# Patient Record
Sex: Female | Born: 1935 | Race: White | Hispanic: No | Smoking: Never smoker
Health system: Southern US, Community
[De-identification: ages and names within clinical notes are randomized; demographics above are authoritative.]

## PROBLEM LIST (undated history)

## (undated) DIAGNOSIS — F329 Major depressive disorder, single episode, unspecified: Secondary | ICD-10-CM

## (undated) DIAGNOSIS — F0280 Dementia in other diseases classified elsewhere without behavioral disturbance: Secondary | ICD-10-CM

## (undated) DIAGNOSIS — K562 Volvulus: Secondary | ICD-10-CM

## (undated) DIAGNOSIS — E119 Type 2 diabetes mellitus without complications: Secondary | ICD-10-CM

## (undated) DIAGNOSIS — G309 Alzheimer's disease, unspecified: Secondary | ICD-10-CM

## (undated) DIAGNOSIS — F0390 Unspecified dementia without behavioral disturbance: Secondary | ICD-10-CM

---

## 2018-11-16 ENCOUNTER — Other Ambulatory Visit: Payer: Self-pay

## 2018-11-16 ENCOUNTER — Emergency Department (HOSPITAL_COMMUNITY): Payer: Medicare Other

## 2018-11-16 ENCOUNTER — Inpatient Hospital Stay (HOSPITAL_COMMUNITY)
Admission: EM | Admit: 2018-11-16 | Discharge: 2018-11-19 | DRG: 552 | Disposition: A | Discharge status: Skilled Nursing Facility | Payer: Medicare Other | Source: Skilled Nursing Facility | Attending: Family Medicine | Admitting: Family Medicine

## 2018-11-16 ENCOUNTER — Encounter (HOSPITAL_COMMUNITY): Payer: Self-pay | Admitting: Emergency Medicine

## 2018-11-16 DIAGNOSIS — F329 Major depressive disorder, single episode, unspecified: Secondary | ICD-10-CM | POA: Diagnosis not present

## 2018-11-16 DIAGNOSIS — E119 Type 2 diabetes mellitus without complications: Secondary | ICD-10-CM

## 2018-11-16 DIAGNOSIS — G309 Alzheimer's disease, unspecified: Secondary | ICD-10-CM | POA: Diagnosis not present

## 2018-11-16 DIAGNOSIS — S22031A Stable burst fracture of third thoracic vertebra, initial encounter for closed fracture: Secondary | ICD-10-CM | POA: Diagnosis present

## 2018-11-16 DIAGNOSIS — F0280 Dementia in other diseases classified elsewhere without behavioral disturbance: Secondary | ICD-10-CM | POA: Diagnosis present

## 2018-11-16 DIAGNOSIS — W1830XA Fall on same level, unspecified, initial encounter: Secondary | ICD-10-CM | POA: Diagnosis not present

## 2018-11-16 DIAGNOSIS — Z79899 Other long term (current) drug therapy: Secondary | ICD-10-CM

## 2018-11-16 DIAGNOSIS — Z794 Long term (current) use of insulin: Secondary | ICD-10-CM | POA: Diagnosis not present

## 2018-11-16 DIAGNOSIS — W19XXXA Unspecified fall, initial encounter: Secondary | ICD-10-CM

## 2018-11-16 DIAGNOSIS — Y92129 Unspecified place in nursing home as the place of occurrence of the external cause: Secondary | ICD-10-CM

## 2018-11-16 DIAGNOSIS — S22001A Stable burst fracture of unspecified thoracic vertebra, initial encounter for closed fracture: Secondary | ICD-10-CM | POA: Diagnosis not present

## 2018-11-16 DIAGNOSIS — F0390 Unspecified dementia without behavioral disturbance: Secondary | ICD-10-CM | POA: Diagnosis present

## 2018-11-16 HISTORY — DX: Type 2 diabetes mellitus without complications: E11.9

## 2018-11-16 HISTORY — DX: Dementia in other diseases classified elsewhere, unspecified severity, without behavioral disturbance, psychotic disturbance, mood disturbance, and anxiety: F02.80

## 2018-11-16 HISTORY — DX: Major depressive disorder, single episode, unspecified: F32.9

## 2018-11-16 HISTORY — DX: Unspecified dementia, unspecified severity, without behavioral disturbance, psychotic disturbance, mood disturbance, and anxiety: F03.90

## 2018-11-16 HISTORY — DX: Volvulus: K56.2

## 2018-11-16 HISTORY — DX: Alzheimer's disease, unspecified: G30.9

## 2018-11-16 LAB — CBC WITH DIFFERENTIAL/PLATELET
Abs Immature Granulocytes: 0.08 10*3/uL — ABNORMAL HIGH (ref 0.00–0.07)
Basophils Absolute: 0 10*3/uL (ref 0.0–0.1)
Basophils Relative: 0 %
Eosinophils Absolute: 0 10*3/uL (ref 0.0–0.5)
Eosinophils Relative: 0 %
HCT: 39.3 % (ref 36.0–46.0)
Hemoglobin: 12.5 g/dL (ref 12.0–15.0)
Immature Granulocytes: 1 %
Lymphocytes Relative: 7 %
Lymphs Abs: 0.6 10*3/uL — ABNORMAL LOW (ref 0.7–4.0)
MCH: 30.1 pg (ref 26.0–34.0)
MCHC: 31.8 g/dL (ref 30.0–36.0)
MCV: 94.7 fL (ref 80.0–100.0)
Monocytes Absolute: 0.6 10*3/uL (ref 0.1–1.0)
Monocytes Relative: 6 %
NRBC: 0 % (ref 0.0–0.2)
Neutro Abs: 7.5 10*3/uL (ref 1.7–7.7)
Neutrophils Relative %: 86 %
Platelets: 149 10*3/uL — ABNORMAL LOW (ref 150–400)
RBC: 4.15 MIL/uL (ref 3.87–5.11)
RDW: 13.3 % (ref 11.5–15.5)
WBC: 8.8 10*3/uL (ref 4.0–10.5)

## 2018-11-16 LAB — COMPREHENSIVE METABOLIC PANEL
ALT: 21 U/L (ref 0–44)
AST: 25 U/L (ref 15–41)
Albumin: 3.8 g/dL (ref 3.5–5.0)
Alkaline Phosphatase: 49 U/L (ref 38–126)
Anion gap: 10 (ref 5–15)
BUN: 12 mg/dL (ref 8–23)
CO2: 24 mmol/L (ref 22–32)
Calcium: 8.4 mg/dL — ABNORMAL LOW (ref 8.9–10.3)
Chloride: 105 mmol/L (ref 98–111)
Creatinine, Ser: 0.5 mg/dL (ref 0.44–1.00)
GFR calc non Af Amer: >60 mL/min (ref >60)
Glucose, Bld: 149 mg/dL — ABNORMAL HIGH (ref 70–99)
Potassium: 3.6 mmol/L (ref 3.5–5.1)
SODIUM: 139 mmol/L (ref 135–145)
Total Bilirubin: 1.1 mg/dL (ref 0.3–1.2)
Total Protein: 6.5 g/dL (ref 6.5–8.1)

## 2018-11-16 LAB — GLUCOSE, CAPILLARY
GLUCOSE-CAPILLARY: 138 mg/dL — AB (ref 70–99)
Glucose-Capillary: 131 mg/dL — ABNORMAL HIGH (ref 70–99)

## 2018-11-16 LAB — HEMOGLOBIN A1C
Hgb A1c MFr Bld: 6.1 % — ABNORMAL HIGH (ref 4.8–5.6)
Mean Plasma Glucose: 128.37 mg/dL

## 2018-11-16 LAB — MAGNESIUM: Magnesium: 2 mg/dL (ref 1.7–2.4)

## 2018-11-16 LAB — PHOSPHORUS: Phosphorus: 3.1 mg/dL (ref 2.5–4.6)

## 2018-11-16 LAB — MRSA PCR SCREENING: MRSA by PCR: NEGATIVE

## 2018-11-16 MED ORDER — ACETAMINOPHEN 325 MG PO TABS
650.0000 mg | ORAL_TABLET | Freq: Four times a day (QID) | ORAL | Status: DC | PRN
  Administered 2018-11-16: 650 mg via ORAL
  Filled 2018-11-16: qty 2

## 2018-11-16 MED ORDER — ONDANSETRON HCL 4 MG/2ML IJ SOLN: 4.0000 mg | Freq: Four times a day (QID) | INTRAMUSCULAR | Status: DC | PRN

## 2018-11-16 MED ORDER — OXYCODONE HCL 5 MG PO TABS: 2.5000 mg | ORAL_TABLET | ORAL | Status: DC | PRN

## 2018-11-16 MED ORDER — MORPHINE SULFATE (PF) 2 MG/ML IV SOLN
2.0000 mg | INTRAVENOUS | Status: DC | PRN
  Administered 2018-11-16: 2 mg via INTRAVENOUS
  Filled 2018-11-16: qty 1

## 2018-11-16 MED ORDER — KETOROLAC TROMETHAMINE 15 MG/ML IJ SOLN: 15.0000 mg | Freq: Once | INTRAMUSCULAR | Status: DC

## 2018-11-16 MED ORDER — FAMOTIDINE 20 MG PO TABS
20.0000 mg | ORAL_TABLET | Freq: Every day | ORAL | Status: DC
  Administered 2018-11-16 – 2018-11-19 (×4): 20 mg via ORAL
  Filled 2018-11-16 (×4): qty 1

## 2018-11-16 MED ORDER — MORPHINE SULFATE (PF) 2 MG/ML IV SOLN: 2.0000 mg | INTRAVENOUS | Status: DC | PRN

## 2018-11-16 MED ORDER — ENOXAPARIN SODIUM 30 MG/0.3ML ~~LOC~~ SOLN
30.0000 mg | SUBCUTANEOUS | Status: DC
  Administered 2018-11-17 – 2018-11-19 (×3): 30 mg via SUBCUTANEOUS
  Filled 2018-11-16 (×3): qty 0.3

## 2018-11-16 MED ORDER — DONEPEZIL HCL 10 MG PO TABS
10.0000 mg | ORAL_TABLET | Freq: Every day | ORAL | Status: DC
  Administered 2018-11-16 – 2018-11-18 (×3): 10 mg via ORAL
  Filled 2018-11-16 (×3): qty 1

## 2018-11-16 MED ORDER — SERTRALINE HCL 25 MG PO TABS
25.0000 mg | ORAL_TABLET | Freq: Every day | ORAL | Status: DC
  Administered 2018-11-16 – 2018-11-19 (×4): 25 mg via ORAL
  Filled 2018-11-16 (×4): qty 1

## 2018-11-16 MED ORDER — MEMANTINE HCL 10 MG PO TABS
10.0000 mg | ORAL_TABLET | Freq: Two times a day (BID) | ORAL | Status: DC
  Administered 2018-11-16 – 2018-11-19 (×7): 10 mg via ORAL
  Filled 2018-11-16 (×7): qty 1

## 2018-11-16 MED ORDER — INSULIN ASPART 100 UNIT/ML ~~LOC~~ SOLN
0.0000 [IU] | Freq: Three times a day (TID) | SUBCUTANEOUS | Status: DC
  Administered 2018-11-16: 1 [IU] via SUBCUTANEOUS
  Administered 2018-11-17 (×2): 2 [IU] via SUBCUTANEOUS
  Administered 2018-11-18: 1 [IU] via SUBCUTANEOUS

## 2018-11-16 MED ORDER — KETOROLAC TROMETHAMINE 15 MG/ML IJ SOLN
15.0000 mg | Freq: Three times a day (TID) | INTRAMUSCULAR | Status: DC
  Administered 2018-11-16: 15 mg via INTRAVENOUS
  Filled 2018-11-16 (×2): qty 1

## 2018-11-16 MED ORDER — ENSURE ENLIVE PO LIQD
237.0000 mL | Freq: Every day | ORAL | Status: DC
  Administered 2018-11-16 – 2018-11-18 (×2): 237 mL via ORAL

## 2018-11-16 MED ORDER — AMITRIPTYLINE HCL 25 MG PO TABS
12.5000 mg | ORAL_TABLET | Freq: Every day | ORAL | Status: DC
  Administered 2018-11-16 – 2018-11-18 (×3): 12.5 mg via ORAL
  Filled 2018-11-16 (×3): qty 0.5

## 2018-11-16 NOTE — Plan of Care (Signed)
  Problem: Education: Goal: Knowledge of General Education information will improve Description Including pain rating scale, medication(s)/side effects and non-pharmacologic comfort measures 11/16/2018 2028 by Iantha Fallenlark, Venisa Frampton E, RN Outcome: Progressing 11/16/2018 2028 by Iantha Fallenlark, Haniyyah Sakuma E, RN Outcome: Progressing   Problem: Health Behavior/Discharge Planning: Goal: Ability to manage health-related needs will improve Outcome: Progressing   Problem: Clinical Measurements: Goal: Will remain free from infection Outcome: Progressing Goal: Respiratory complications will improve Outcome: Progressing

## 2018-11-16 NOTE — ED Provider Notes (Signed)
Emergency Department Provider Note   I have reviewed the triage vital signs and the nursing notes.   HISTORY  Chief Complaint Fall and Back Pain   HPI Katherine Wilson is a 84 y.o. female with PMH of Dementia, DM, and depression presents to the ED for evaluation after fall.  Patient lives at [facility].  Staff heard the patient yelling and found her on the floor.  Unknown mechanism of fall.  Patient was primarily complaining of back pain at that time.  She continues to complain of back discomfort with me here but states it is mid back and slightly to the right. She denies any shortness of breath.  No numbness or tingling.   Level 5 caveat: Dementia    Past Medical History:  Diagnosis Date  . Alzheimer disease (HCC)   . Dementia (HCC)   . Diabetes mellitus without complication (HCC)   . Major depression   . Volvulus (HCC)     There are no active problems to display for this patient.   History reviewed. No pertinent surgical history.  Allergies Chicken allergy; Eggs or egg-derived products; and Latex  History reviewed. No pertinent family history.  Social History Social History   Tobacco Use  . Smoking status: Never Smoker  . Smokeless tobacco: Never Used  Substance Use Topics  . Alcohol use: Not Currently  . Drug use: Not Currently    Review of Systems  Constitutional: No fever/chills Cardiovascular: Denies chest pain. Respiratory: Denies shortness of breath. Gastrointestinal: No abdominal pain.  No nausea, no vomiting.  No diarrhea.  No constipation. Musculoskeletal: Positive mid back/posterior chest wall pain.  Skin: Negative for rash. Neurological: Negative for headaches.  10-point ROS otherwise negative.  ____________________________________________   PHYSICAL EXAM:  VITAL SIGNS: ED Triage Vitals  Enc Vitals Group     BP 11/16/18 0654 138/65     Pulse Rate 11/16/18 0654 66     Resp 11/16/18 0654 14     Temp 11/16/18 0654 97.9 F (36.6 C)       Temp Source 11/16/18 0654 Oral     SpO2 11/16/18 0654 97 %     Pain Score 11/16/18 0637 4   Constitutional: Alert and oriented. Well appearing and in no acute distress. Eyes: Conjunctivae are normal. PERRL.  Head: Atraumatic. Nose: No congestion/rhinnorhea. Mouth/Throat: Mucous membranes are moist.  Neck: No stridor. No cervical spine tenderness to palpation. Cardiovascular: Normal rate, regular rhythm. Good peripheral circulation. Grossly normal heart sounds.   Respiratory: Normal respiratory effort.  No retractions. Lungs CTAB. Gastrointestinal: Soft and nontender. No distention.  Musculoskeletal: No lower extremity tenderness nor edema. No gross deformities of extremities. Normal passive ROM of the bilateral hips and knees without pain. No midline thoracic or lumbar spine tenderness. Mild tenderness over the right posterior chest wall. No crepitus.  Neurologic:  Normal speech and language. No gross focal neurologic deficits are appreciated.  Skin:  Skin is warm, dry and intact. No rash noted.  ____________________________________________  RADIOLOGY  Dg Chest 2 View  Result Date: 11/16/2018 CLINICAL DATA:  Chest pain EXAM: CHEST - 2 VIEW COMPARISON:  None. FINDINGS: The patient's mandible obscures portions of the apices. There is atelectatic change in the left lower lobe. Visualized lungs elsewhere clear. Heart size and pulmonary vascularity are normal. No adenopathy evident. Aorta is prominent. No bone lesions evident. IMPRESSION: Note that patient's mandible obscures portions of the apices. There is atelectatic change in the left lower lobe. Visualized lungs elsewhere clear. Heart size  normal. Prominence of the aorta may reflect chronic hypertensive change. Electronically Signed   By: Bretta BangWilliam  Woodruff III M.D.   On: 11/16/2018 08:00   Ct Head Wo Contrast  Result Date: 11/16/2018 CLINICAL DATA:  Unwitnessed fall.  Alzheimer's dementia EXAM: CT HEAD WITHOUT CONTRAST CT CERVICAL  SPINE WITHOUT CONTRAST TECHNIQUE: Multidetector CT imaging of the head and cervical spine was performed following the standard protocol without intravenous contrast. Multiplanar CT image reconstructions of the cervical spine were also generated. COMPARISON:  None. FINDINGS: CT HEAD FINDINGS Brain: There is moderate diffuse atrophy. There is no intracranial mass, hemorrhage, extra-axial fluid collection, or midline shift. There is patchy small vessel disease in the centra semiovale bilaterally. There is a small lacunar infarct in the genu of the left internal capsule. No acute appearing infarct is demonstrable on this study. Vascular: There is no hyperdense vessel. There is calcification in each carotid siphon region. Skull: The bony calvarium appears intact. There is frontal hyperostosis. Sinuses/Orbits: There is mucosal thickening in several ethmoid air cells as well as in portions of the right maxillary antrum. There is also opacification in the inferomedial right frontal sinus. Orbits appear symmetric bilaterally except for evidence of cataract removal on the left. Other: Mastoid air cells are clear. CT CERVICAL SPINE FINDINGS Note that there is a degree of motion artifact. Alignment: There is 3 mm of retrolisthesis of C6 on C7. No other spondylolisthesis is evident. Skull base and vertebrae: Skull base and craniocervical junction regions appear normal. There is pannus posterior to the odontoid, not causing appreciable impression on the craniocervical junction. There is a wedge fracture of the T3 vertebral body with mild retropulsion of bone into the anterior thecal sac region. No fracture is appreciable in the cervical region. No blastic or lytic bone lesions are evident. Soft tissues and spinal canal: The prevertebral soft tissues and predental space regions are normal. There is no cord or canal hematoma evident. No paraspinous lesions. Disc levels: There is severe disc space narrowing at C5-6 and C6-7. There  is milder disc space narrowing at C4-5. There are prominent anterior osteophytes at C5 and C6. There is facet hypertrophy at multiple levels bilaterally. There is exit foraminal narrowing at multiple levels, including at C3-4 on the right, at C4-5 on the left, at C5-6 bilaterally, and C6-7 bilaterally. No frank disc extrusion or stenosis is evident. Upper chest: There is aortic atherosclerosis. Visualized upper lung regions are clear. Other: There are foci of calcification in each carotid artery. IMPRESSION: CT head: Atrophy with periventricular small vessel disease. Prior small lacunar infarct in the genu of the left internal capsule. No acute infarct. No mass or hemorrhage. There are foci of arterial vascular calcification. There are foci of paranasal sinus disease. CT cervical spine: Less than optimal study due to motion artifact. Wedge fracture of the T3 vertebral body, acute appearing, with evidence of mild retropulsion of bone posteriorly. This fracture appears acute. No acute appearing cervical fracture seen with limitations due to motion. Mild retrolisthesis of C6 on C7 is felt to be due to underlying spondylosis. Note that there is multilevel arthropathy with exit foraminal narrowing due to bony hypertrophy at multiple levels. There is aortic and carotid artery calcification. Aortic Atherosclerosis (ICD10-I70.0). Critical Value/emergent results were called by telephone at the time of interpretation on 11/16/2018 at 8:14 am to Dr. Alona BeneJOSHUA Angelamarie Avakian , who verbally acknowledged these results. Electronically Signed   By: Bretta BangWilliam  Woodruff III M.D.   On: 11/16/2018 08:14   Ct Cervical  Spine Wo Contrast  Result Date: 11/16/2018 CLINICAL DATA:  Unwitnessed fall.  Alzheimer's dementia EXAM: CT HEAD WITHOUT CONTRAST CT CERVICAL SPINE WITHOUT CONTRAST TECHNIQUE: Multidetector CT imaging of the head and cervical spine was performed following the standard protocol without intravenous contrast. Multiplanar CT image  reconstructions of the cervical spine were also generated. COMPARISON:  None. FINDINGS: CT HEAD FINDINGS Brain: There is moderate diffuse atrophy. There is no intracranial mass, hemorrhage, extra-axial fluid collection, or midline shift. There is patchy small vessel disease in the centra semiovale bilaterally. There is a small lacunar infarct in the genu of the left internal capsule. No acute appearing infarct is demonstrable on this study. Vascular: There is no hyperdense vessel. There is calcification in each carotid siphon region. Skull: The bony calvarium appears intact. There is frontal hyperostosis. Sinuses/Orbits: There is mucosal thickening in several ethmoid air cells as well as in portions of the right maxillary antrum. There is also opacification in the inferomedial right frontal sinus. Orbits appear symmetric bilaterally except for evidence of cataract removal on the left. Other: Mastoid air cells are clear. CT CERVICAL SPINE FINDINGS Note that there is a degree of motion artifact. Alignment: There is 3 mm of retrolisthesis of C6 on C7. No other spondylolisthesis is evident. Skull base and vertebrae: Skull base and craniocervical junction regions appear normal. There is pannus posterior to the odontoid, not causing appreciable impression on the craniocervical junction. There is a wedge fracture of the T3 vertebral body with mild retropulsion of bone into the anterior thecal sac region. No fracture is appreciable in the cervical region. No blastic or lytic bone lesions are evident. Soft tissues and spinal canal: The prevertebral soft tissues and predental space regions are normal. There is no cord or canal hematoma evident. No paraspinous lesions. Disc levels: There is severe disc space narrowing at C5-6 and C6-7. There is milder disc space narrowing at C4-5. There are prominent anterior osteophytes at C5 and C6. There is facet hypertrophy at multiple levels bilaterally. There is exit foraminal narrowing  at multiple levels, including at C3-4 on the right, at C4-5 on the left, at C5-6 bilaterally, and C6-7 bilaterally. No frank disc extrusion or stenosis is evident. Upper chest: There is aortic atherosclerosis. Visualized upper lung regions are clear. Other: There are foci of calcification in each carotid artery. IMPRESSION: CT head: Atrophy with periventricular small vessel disease. Prior small lacunar infarct in the genu of the left internal capsule. No acute infarct. No mass or hemorrhage. There are foci of arterial vascular calcification. There are foci of paranasal sinus disease. CT cervical spine: Less than optimal study due to motion artifact. Wedge fracture of the T3 vertebral body, acute appearing, with evidence of mild retropulsion of bone posteriorly. This fracture appears acute. No acute appearing cervical fracture seen with limitations due to motion. Mild retrolisthesis of C6 on C7 is felt to be due to underlying spondylosis. Note that there is multilevel arthropathy with exit foraminal narrowing due to bony hypertrophy at multiple levels. There is aortic and carotid artery calcification. Aortic Atherosclerosis (ICD10-I70.0). Critical Value/emergent results were called by telephone at the time of interpretation on 11/16/2018 at 8:14 am to Dr. Alona BeneJOSHUA Iriana Artley , who verbally acknowledged these results. Electronically Signed   By: Bretta BangWilliam  Woodruff III M.D.   On: 11/16/2018 08:14   Ct Thoracic Spine Wo Contrast  Result Date: 11/16/2018 CLINICAL DATA:  Found down at nursing home. History of diabetes, Alzheimer's/dementia. T3 fracture on cervical spine CT. EXAM: CT THORACIC  SPINE WITHOUT CONTRAST TECHNIQUE: Multidetector CT images of the thoracic were obtained using the standard protocol without intravenous contrast. COMPARISON:  CT cervical spine same date. FINDINGS: Alignment: Normal. Vertebrae: As demonstrated on earlier cervical spine CT, there is an acute-appearing burst fracture involving the T3  vertebral body. This is associated with approximately 40% loss the vertebral body height centrally and 3 mm of osseous retropulsion. The posterior elements appear intact, and there is no widening of the interpedicular distance. No other acute osseous findings are demonstrated. Paraspinal and other soft tissues: Probable mild paraspinal hemorrhage at the level of the T3 fracture. No evidence of epidural hematoma. There is atherosclerosis of the aorta, great vessels and coronary arteries. There are dependent opacities in both lungs which likely represent atelectasis. Probable left renal cyst. Disc levels: Mild degenerative changes throughout the visualized spine with posterior osteophytes. There is a small central disc protrusion at T6-7. No large disc herniation or high-grade spinal stenosis identified. IMPRESSION: 1. Acute burst fracture at T3 with 3 mm of osseous retropulsion. 2. No other acute findings within the thoracic spine. 3. Mild thoracic spondylosis.  Small disc protrusion at T6-7. Electronically Signed   By: Carey Bullocks M.D.   On: 11/16/2018 08:56    ____________________________________________   PROCEDURES  Procedure(s) performed:   Procedures  None ____________________________________________   INITIAL IMPRESSION / ASSESSMENT AND PLAN / ED COURSE  Pertinent labs & imaging results that were available during my care of the patient were reviewed by me and considered in my medical decision making (see chart for details).  Patient presents to the emergency department after fall from standing at her nursing home.  She has no significant midline spine tenderness.  Most of her pain seems to be right posterior chest wall. B/L breath sounds. Dementia limiting history. No concerning findings on exam. Plan for CT imaging or the head, c-spine, and CXR.   10:00 AM Spoke with Dr. Newell Coral with Neurosurgery regarding the thoracic spine fracture. He reviewed the case by phone and CT images.  The recommendation is 2-3 days of bedrest and pain control. Patient can be WBAT after that with PT/OT after that. No surgery intervention.   Also spoke with patient's POA listed in Epic, Dara Lords. She is aware of the CT findings and plan. Patient is a FULL CODE.   Discussed patient's case with Hospitalist, Dr. Robb Matar to request admission. Patient and family (if present) updated with plan. Care transferred to Hospitalist service.  I reviewed all nursing notes, vitals, pertinent old records, EKGs, labs, imaging (as available).  ____________________________________________  FINAL CLINICAL IMPRESSION(S) / ED DIAGNOSES  Final diagnoses:  Closed stable burst fracture of third thoracic vertebra, initial encounter [facility])  Fall, initial encounter    Note:  This document was prepared using Dragon voice recognition software and may include unintentional dictation errors.  Alona Bene, MD Emergency Medicine    Tenecia Ignasiak, Arlyss Repress, MD 11/16/18 1003

## 2018-11-16 NOTE — Plan of Care (Signed)
  Problem: Education: Goal: Knowledge of General Education information will improve Description: Including pain rating scale, medication(s)/side effects and non-pharmacologic comfort measures Outcome: Progressing   Problem: Clinical Measurements: Goal: Will remain free from infection Outcome: Progressing Goal: Respiratory complications will improve Outcome: Progressing   

## 2018-11-16 NOTE — ED Notes (Signed)
ED TO INPATIENT HANDOFF REPORT  Name/Age/Gender Katherine Wilson 84 y.o. female  Code Status    Code Status Orders  (From admission, onward)         Start     Ordered   11/16/18 1026  Full code  Continuous     11/16/18 1026        Code Status History    This patient has a current code status but no historical code status.      Home/SNF/Other Nursing Home  Chief Complaint fall, back pain  Level of Care/Admitting Diagnosis ED Disposition    ED Disposition Condition Comment   Admit  [facility] [[id number]]  Level of Care: Med-Surg [16]  Diagnosis: Burst fracture of thoracic vertebra, closed, initial encounter [facility](HCC) [[id number]]  Admitting Physician: Bobette MoTIZ, DAVID MANUEL [[id number]]  Attending Physician: Bobette MoORTIZ, DAVID MANUEL [[id number]]  Estimated length of stay: past midnight tomorrow  Certification:: I certify this patient will need inpatient services for at least 2 midnights  PT Class (Do Not Modify): Inpatient [101]  PT Acc Code (Do Not Modify): Private [1]       Medical History Past Medical History:  Diagnosis Date  . Alzheimer disease (HCC)   . Dementia (HCC)   . Diabetes mellitus without complication (HCC)   . Major depression   . Volvulus (HCC)     Allergies Allergies  Allergen Reactions  . Chicken Allergy Other (See Comments)    Per MAR - unknown reaction  . Eggs Or Egg-Derived Products Other (See Comments)    Per MAR - unknown reaction  . Latex Other (See Comments)    Per MAR - unknown reaction    IV Location/Drains/Wounds Patient Lines/Drains/Airways Status   Active Line/Drains/Airways    Name:   Placement date:   Placement time:   Site:   Days:   Peripheral IV 11/16/18 Left Antecubital   11/16/18    0623    Antecubital   less than 1          Labs/Imaging Results for orders placed or performed during the hospital encounter of 11/16/18 (from the past 48 hour(s))  CBC with Differential/Platelet     Status:  Abnormal   Collection Time: 11/16/18 10:41 AM  Result Value Ref Range   WBC 8.8 4.0 - 10.5 K/uL   RBC 4.15 3.87 - 5.11 MIL/uL   Hemoglobin 12.5 12.0 - 15.0 g/dL   HCT 95.639.3 21.336.0 - 08.646.0 %   MCV 94.7 80.0 - 100.0 fL   MCH 30.1 26.0 - 34.0 pg   MCHC 31.8 30.0 - 36.0 g/dL   RDW 57.813.3 46.911.5 - 62.915.5 %   Platelets 149 (L) 150 - 400 K/uL   nRBC 0.0 0.0 - 0.2 %   Neutrophils Relative % 86 %   Neutro Abs 7.5 1.7 - 7.7 K/uL   Lymphocytes Relative 7 %   Lymphs Abs 0.6 (L) 0.7 - 4.0 K/uL   Monocytes Relative 6 %   Monocytes Absolute 0.6 0.1 - 1.0 K/uL   Eosinophils Relative 0 %   Eosinophils Absolute 0.0 0.0 - 0.5 K/uL   Basophils Relative 0 %   Basophils Absolute 0.0 0.0 - 0.1 K/uL   Immature Granulocytes 1 %   Abs Immature Granulocytes 0.08 (H) 0.00 - 0.07 K/uL    Comment: [facility]formed at [facility], [address] [place], [place] [id number]   Dg Chest 2 View  Result Date: 11/16/2018 CLINICAL DATA:  Chest pain EXAM: CHEST -  2 VIEW COMPARISON:  None. FINDINGS: The patient's mandible obscures portions of the apices. There is atelectatic change in the left lower lobe. Visualized lungs elsewhere clear. Heart size and pulmonary vascularity are normal. No adenopathy evident. Aorta is prominent. No bone lesions evident. IMPRESSION: Note that patient's mandible obscures portions of the apices. There is atelectatic change in the left lower lobe. Visualized lungs elsewhere clear. Heart size normal. Prominence of the aorta may reflect chronic hypertensive change. Electronically Signed   By: Bretta BangWilliam  Woodruff III M.D.   On: 11/16/2018 08:00   Ct Head Wo Contrast  Result Date: 11/16/2018 CLINICAL DATA:  Unwitnessed fall.  Alzheimer's dementia EXAM: CT HEAD WITHOUT CONTRAST CT CERVICAL SPINE WITHOUT CONTRAST TECHNIQUE: Multidetector CT imaging of the head and cervical spine was performed following the standard protocol without intravenous contrast. Multiplanar CT image reconstructions of the  cervical spine were also generated. COMPARISON:  None. FINDINGS: CT HEAD FINDINGS Brain: There is moderate diffuse atrophy. There is no intracranial mass, hemorrhage, extra-axial fluid collection, or midline shift. There is patchy small vessel disease in the centra semiovale bilaterally. There is a small lacunar infarct in the genu of the left internal capsule. No acute appearing infarct is demonstrable on this study. Vascular: There is no hyperdense vessel. There is calcification in each carotid siphon region. Skull: The bony calvarium appears intact. There is frontal hyperostosis. Sinuses/Orbits: There is mucosal thickening in several ethmoid air cells as well as in portions of the right maxillary antrum. There is also opacification in the inferomedial right frontal sinus. Orbits appear symmetric bilaterally except for evidence of cataract removal on the left. Other: Mastoid air cells are clear. CT CERVICAL SPINE FINDINGS Note that there is a degree of motion artifact. Alignment: There is 3 mm of retrolisthesis of C6 on C7. No other spondylolisthesis is evident. Skull base and vertebrae: Skull base and craniocervical junction regions appear normal. There is pannus posterior to the odontoid, not causing appreciable impression on the craniocervical junction. There is a wedge fracture of the T3 vertebral body with mild retropulsion of bone into the anterior thecal sac region. No fracture is appreciable in the cervical region. No blastic or lytic bone lesions are evident. Soft tissues and spinal canal: The prevertebral soft tissues and predental space regions are normal. There is no cord or canal hematoma evident. No paraspinous lesions. Disc levels: There is severe disc space narrowing at C5-6 and C6-7. There is milder disc space narrowing at C4-5. There are prominent anterior osteophytes at C5 and C6. There is facet hypertrophy at multiple levels bilaterally. There is exit foraminal narrowing at multiple levels,  including at C3-4 on the right, at C4-5 on the left, at C5-6 bilaterally, and C6-7 bilaterally. No frank disc extrusion or stenosis is evident. Upper chest: There is aortic atherosclerosis. Visualized upper lung regions are clear. Other: There are foci of calcification in each carotid artery. IMPRESSION: CT head: Atrophy with periventricular small vessel disease. Prior small lacunar infarct in the genu of the left internal capsule. No acute infarct. No mass or hemorrhage. There are foci of arterial vascular calcification. There are foci of paranasal sinus disease. CT cervical spine: Less than optimal study due to motion artifact. Wedge fracture of the T3 vertebral body, acute appearing, with evidence of mild retropulsion of bone posteriorly. This fracture appears acute. No acute appearing cervical fracture seen with limitations due to motion. Mild retrolisthesis of C6 on C7 is felt to be due to underlying spondylosis. Note that there is  multilevel arthropathy with exit foraminal narrowing due to bony hypertrophy at multiple levels. There is aortic and carotid artery calcification. Aortic Atherosclerosis (ICD10-I70.0). Critical Value/emergent results were called by telephone at the time of interpretation on 11/16/2018 at 8:14 am to Dr. Alona Bene , who verbally acknowledged these results. Electronically Signed   By: Bretta Bang III M.D.   On: 11/16/2018 08:14   Ct Cervical Spine Wo Contrast  Result Date: 11/16/2018 CLINICAL DATA:  Unwitnessed fall.  Alzheimer's dementia EXAM: CT HEAD WITHOUT CONTRAST CT CERVICAL SPINE WITHOUT CONTRAST TECHNIQUE: Multidetector CT imaging of the head and cervical spine was performed following the standard protocol without intravenous contrast. Multiplanar CT image reconstructions of the cervical spine were also generated. COMPARISON:  None. FINDINGS: CT HEAD FINDINGS Brain: There is moderate diffuse atrophy. There is no intracranial mass, hemorrhage, extra-axial fluid  collection, or midline shift. There is patchy small vessel disease in the centra semiovale bilaterally. There is a small lacunar infarct in the genu of the left internal capsule. No acute appearing infarct is demonstrable on this study. Vascular: There is no hyperdense vessel. There is calcification in each carotid siphon region. Skull: The bony calvarium appears intact. There is frontal hyperostosis. Sinuses/Orbits: There is mucosal thickening in several ethmoid air cells as well as in portions of the right maxillary antrum. There is also opacification in the inferomedial right frontal sinus. Orbits appear symmetric bilaterally except for evidence of cataract removal on the left. Other: Mastoid air cells are clear. CT CERVICAL SPINE FINDINGS Note that there is a degree of motion artifact. Alignment: There is 3 mm of retrolisthesis of C6 on C7. No other spondylolisthesis is evident. Skull base and vertebrae: Skull base and craniocervical junction regions appear normal. There is pannus posterior to the odontoid, not causing appreciable impression on the craniocervical junction. There is a wedge fracture of the T3 vertebral body with mild retropulsion of bone into the anterior thecal sac region. No fracture is appreciable in the cervical region. No blastic or lytic bone lesions are evident. Soft tissues and spinal canal: The prevertebral soft tissues and predental space regions are normal. There is no cord or canal hematoma evident. No paraspinous lesions. Disc levels: There is severe disc space narrowing at C5-6 and C6-7. There is milder disc space narrowing at C4-5. There are prominent anterior osteophytes at C5 and C6. There is facet hypertrophy at multiple levels bilaterally. There is exit foraminal narrowing at multiple levels, including at C3-4 on the right, at C4-5 on the left, at C5-6 bilaterally, and C6-7 bilaterally. No frank disc extrusion or stenosis is evident. Upper chest: There is aortic  atherosclerosis. Visualized upper lung regions are clear. Other: There are foci of calcification in each carotid artery. IMPRESSION: CT head: Atrophy with periventricular small vessel disease. Prior small lacunar infarct in the genu of the left internal capsule. No acute infarct. No mass or hemorrhage. There are foci of arterial vascular calcification. There are foci of paranasal sinus disease. CT cervical spine: Less than optimal study due to motion artifact. Wedge fracture of the T3 vertebral body, acute appearing, with evidence of mild retropulsion of bone posteriorly. This fracture appears acute. No acute appearing cervical fracture seen with limitations due to motion. Mild retrolisthesis of C6 on C7 is felt to be due to underlying spondylosis. Note that there is multilevel arthropathy with exit foraminal narrowing due to bony hypertrophy at multiple levels. There is aortic and carotid artery calcification. Aortic Atherosclerosis (ICD10-I70.0). Critical Value/emergent results were called  by telephone at the time of interpretation on 11/16/2018 at 8:14 am to Dr. Alona Bene , who verbally acknowledged these results. Electronically Signed   By: Bretta Bang III M.D.   On: 11/16/2018 08:14   Ct Thoracic Spine Wo Contrast  Result Date: 11/16/2018 CLINICAL DATA:  Found down at nursing home. History of diabetes, Alzheimer's/dementia. T3 fracture on cervical spine CT. EXAM: CT THORACIC SPINE WITHOUT CONTRAST TECHNIQUE: Multidetector CT images of the thoracic were obtained using the standard protocol without intravenous contrast. COMPARISON:  CT cervical spine same date. FINDINGS: Alignment: Normal. Vertebrae: As demonstrated on earlier cervical spine CT, there is an acute-appearing burst fracture involving the T3 vertebral body. This is associated with approximately 40% loss the vertebral body height centrally and 3 mm of osseous retropulsion. The posterior elements appear intact, and there is no widening of  the interpedicular distance. No other acute osseous findings are demonstrated. Paraspinal and other soft tissues: Probable mild paraspinal hemorrhage at the level of the T3 fracture. No evidence of epidural hematoma. There is atherosclerosis of the aorta, great vessels and coronary arteries. There are dependent opacities in both lungs which likely represent atelectasis. Probable left renal cyst. Disc levels: Mild degenerative changes throughout the visualized spine with posterior osteophytes. There is a small central disc protrusion at T6-7. No large disc herniation or high-grade spinal stenosis identified. IMPRESSION: 1. Acute burst fracture at T3 with 3 mm of osseous retropulsion. 2. No other acute findings within the thoracic spine. 3. Mild thoracic spondylosis.  Small disc protrusion at T6-7. Electronically Signed   By: Carey Bullocks M.D.   On: 11/16/2018 08:56   None  Pending Labs Unresulted Labs (From admission, onward)    Start     Ordered   11/23/18 0500  Creatinine, serum  (enoxaparin (LOVENOX)    CrCl < 30 ml/min)  Weekly,   R    Comments:  while on enoxaparin therapy.    11/16/18 1026   11/16/18 1029  Magnesium  Add-on,   R     11/16/18 1028   11/16/18 1029  Phosphorus  Add-on,   R     11/16/18 1028   11/16/18 1024  Comprehensive metabolic panel  ONCE - STAT,   R     11/16/18 1023   11/16/18 1024  Urinalysis, Routine w reflex microscopic  Once,   R     11/16/18 1023          Vitals/Pain Today's Vitals   11/16/18 0654 11/16/18 0700 11/16/18 0900 11/16/18 1000  BP: 138/65 137/66 137/67 (!) 149/102  Pulse: 66 67 72 78  Resp: 14     Temp: 97.9 F (36.6 C)     TempSrc: Oral     SpO2: 97% 94% 95% 90%  PainSc:        Isolation Precautions No active isolations  Medications Medications  ondansetron (ZOFRAN) injection 4 mg (has no administration in time range)  enoxaparin (LOVENOX) injection 30 mg (has no administration in time range)  acetaminophen (TYLENOL) tablet 650  mg (has no administration in time range)  oxyCODONE (Oxy IR/ROXICODONE) immediate release tablet 2.5 mg (has no administration in time range)  morphine 2 MG/ML injection 2 mg (2 mg Intravenous Given 11/16/18 1042)    Mobility power wheelchair

## 2018-11-16 NOTE — ED Notes (Signed)
Bed: [id number] Expected date:  Expected time:  Means of arrival:  Comments: EMS 84 yo female fall-thoracic pain/pain meds administered

## 2018-11-16 NOTE — H&P (Signed)
History and Physical    Katherine Wilson ZOX:[id number] DOB: [birth year] DOA: 11/16/2018  PCP: System, Pcp Not In   Patient coming from: Home.  I have personally briefly reviewed patient's old medical records in [facility]  Chief Complaint: Fall.  HPI: Katherine Wilson is a 84 y.o. female with medical history significant of Alzheimer's disease, type 2 diabetes, major depression, history of volvulus who was transferred to the emergency department after having a fall due to an unknown mechanism at her nursing home facility.  Patient knows that she fell, but is unable to elaborate further on what happened.  She complains of pain in her back, but denies any other acute complaints.  Review of system is limited by the patient's dementia.  ED Course: Temperature 97.9 F, pulse 66, respirations 14, blood pressure 136/65 mmHg and O2 sat 97% on room air.  No meds were ordered in the ED.  I added morphine as needed and Toradol.  White count was 8.8, hemoglobin 12.5 g/dL and platelets 811.  CMP shows a glucose of 149 and a calcium level of 8.4 mg/dL, all other values are within normal limits.  Imaging: CT head did not show any acute abnormalities.  There is a  prior lacunar infarct in the genu of the left internal capsule.  Cervical spine shows DDD changes, but no acute fractures.  However, CT thoracic spine shows an acute burst fracture of T3 with 3 mm abolishes retropulsion.  There were no other acute findings.  There is a small disc protrusion at T6-7.  Her chest radiograph shows LLL atelectasis.  Otherwise, there is no other abnormal change.  Please see images and full radiology report for further detail.  Review of Systems: Unable to fully obtain..   Past Medical History:  Diagnosis Date  . Alzheimer disease (HCC)   . Dementia (HCC)   . Diabetes mellitus without complication (HCC)   . Major depression   . Volvulus (HCC)     History reviewed. No pertinent surgical history.   reports that  she has never smoked. She has never used smokeless tobacco. She reports previous alcohol use. She reports previous drug use.  Allergies  Allergen Reactions  . Chicken Allergy Other (See Comments)    Per MAR - unknown reaction  . Eggs Or Egg-Derived Products Other (See Comments)    Per MAR - unknown reaction  . Latex Other (See Comments)    Per MAR - unknown reaction   Family medical history. Unable to obtain at this time.   Prior to Admission medications   Medication Sig Start Date End Date Taking? Authorizing Provider  Amitriptyline HCl (ELAVIL PO) Take 12.5 mg by mouth at bedtime.   Yes [provider]  donepezil (ARICEPT) 10 MG tablet Take 10 mg by mouth at bedtime.   Yes [provider]  ENSURE (ENSURE) Take 237 mLs by mouth daily after supper.   Yes [provider]  insulin lispro (HUMALOG) 100 UNIT/ML injection Inject 0-14 Units into the skin 2 (two) times daily. Per sliding scale, 200 = 0 units 201-250 = 2 units 251-300 = 4 units 301-350 = 6 units 351-400 = 8 units 401-450 = 10 units 451-500 = 12 units If high = 14 units and recheck in 2 hours If above 400 or below 100 = call MD   Yes [provider]  memantine (NAMENDA) 10 MG tablet Take 10 mg by mouth 2 (two) times daily.   Yes [provider]  sertraline (ZOLOFT)  25 MG tablet Take 25 mg by mouth daily.   Yes [provider]    Physical Exam: Vitals:   11/16/18 0700 11/16/18 0900 11/16/18 1000 11/16/18 1100  BP: 137/66 137/67 (!) 149/102 129/62  Pulse: 67 72 78 75  Resp:      Temp:      TempSrc:      SpO2: 94% 95% 90% 91%    Constitutional: NAD, calm, comfortable Eyes: PERRL, lids and conjunctivae normal ENMT: Mucous membranes are moist. Posterior pharynx clear of any exudate or lesions. Neck: normal, supple, no masses, no thyromegaly Respiratory: clear to auscultation bilaterally on anterior exam, no wheezing, no crackles. Normal respiratory effort. No  accessory muscle use.  Cardiovascular: Regular rate and rhythm, no murmurs / rubs / gallops. No extremity edema. 2+ pedal pulses. No carotid bruits.  Abdomen: Soft, no tenderness, no masses palpated. No hepatosplenomegaly. Bowel sounds positive.  Musculoskeletal: no clubbing / cyanosis. Good ROM, no contractures. Normal muscle tone.  Skin: no rashes, lesions, ulcers. No induration Neurologic: CN 2-12 grossly intact. Sensation intact, DTR normal. Strength 5/5 in all 4.  Psychiatric:  Alert and oriented x 2, knows she is in the hospital but could not specify which one.  Knows the month, but disoriented to the rest of time and date.  Unable to remember who is the president of [place].  Labs on Admission: I have personally reviewed following labs and imaging studies  CBC: Recent Labs  Lab 11/16/18 1041  WBC 8.8  NEUTROABS 7.5  HGB 12.5  HCT 39.3  MCV 94.7  PLT 149*   Basic Metabolic Panel: Recent Labs  Lab 11/16/18 1041  NA 139  K 3.6  CL 105  CO2 24  GLUCOSE 149*  BUN 12  CREATININE 0.50  CALCIUM 8.4*  MG 2.0  PHOS 3.1   GFR: CrCl cannot be calculated (Unknown ideal weight.). Liver Function Tests: Recent Labs  Lab 11/16/18 1041  AST 25  ALT 21  ALKPHOS 49  BILITOT 1.1  PROT 6.5  ALBUMIN 3.8   No results for input(s): LIPASE, AMYLASE in the last 168 hours. No results for input(s): AMMONIA in the last 168 hours. Coagulation Profile: No results for input(s): INR, PROTIME in the last 168 hours. Cardiac Enzymes: No results for input(s): CKTOTAL, CKMB, CKMBINDEX, TROPONINI in the last 168 hours. BNP (last 3 results) No results for input(s): PROBNP in the last 8760 hours. HbA1C: No results for input(s): HGBA1C in the last 72 hours. CBG: No results for input(s): GLUCAP in the last 168 hours. Lipid Profile: No results for input(s): CHOL, HDL, LDLCALC, TRIG, CHOLHDL, LDLDIRECT in the last 72 hours. Thyroid Function Tests: No results for input(s): TSH, T4TOTAL,  FREET4, T3FREE, THYROIDAB in the last 72 hours. Anemia Panel: No results for input(s): VITAMINB12, FOLATE, FERRITIN, TIBC, IRON, RETICCTPCT in the last 72 hours. Urine analysis: No results found for: COLORURINE, APPEARANCEUR, LABSPEC, PHURINE, GLUCOSEU, HGBUR, BILIRUBINUR, KETONESUR, PROTEINUR, UROBILINOGEN, NITRITE, LEUKOCYTESUR  Radiological Exams on Admission: Dg Chest 2 View  Result Date: 11/16/2018 CLINICAL DATA:  Chest pain EXAM: CHEST - 2 VIEW COMPARISON:  None. FINDINGS: The patient's mandible obscures portions of the apices. There is atelectatic change in the left lower lobe. Visualized lungs elsewhere clear. Heart size and pulmonary vascularity are normal. No adenopathy evident. Aorta is prominent. No bone lesions evident. IMPRESSION: Note that patient's mandible obscures portions of the apices. There is atelectatic change in the left lower lobe. Visualized lungs elsewhere clear. Heart size normal. Prominence  of the aorta may reflect chronic hypertensive change. Electronically Signed   By: Bretta BangWilliam  Woodruff III M.D.   On: 11/16/2018 08:00   Ct Head Wo Contrast  Result Date: 11/16/2018 CLINICAL DATA:  Unwitnessed fall.  Alzheimer's dementia EXAM: CT HEAD WITHOUT CONTRAST CT CERVICAL SPINE WITHOUT CONTRAST TECHNIQUE: Multidetector CT imaging of the head and cervical spine was performed following the standard protocol without intravenous contrast. Multiplanar CT image reconstructions of the cervical spine were also generated. COMPARISON:  None. FINDINGS: CT HEAD FINDINGS Brain: There is moderate diffuse atrophy. There is no intracranial mass, hemorrhage, extra-axial fluid collection, or midline shift. There is patchy small vessel disease in the centra semiovale bilaterally. There is a small lacunar infarct in the genu of the left internal capsule. No acute appearing infarct is demonstrable on this study. Vascular: There is no hyperdense vessel. There is calcification in each carotid siphon  region. Skull: The bony calvarium appears intact. There is frontal hyperostosis. Sinuses/Orbits: There is mucosal thickening in several ethmoid air cells as well as in portions of the right maxillary antrum. There is also opacification in the inferomedial right frontal sinus. Orbits appear symmetric bilaterally except for evidence of cataract removal on the left. Other: Mastoid air cells are clear. CT CERVICAL SPINE FINDINGS Note that there is a degree of motion artifact. Alignment: There is 3 mm of retrolisthesis of C6 on C7. No other spondylolisthesis is evident. Skull base and vertebrae: Skull base and craniocervical junction regions appear normal. There is pannus posterior to the odontoid, not causing appreciable impression on the craniocervical junction. There is a wedge fracture of the T3 vertebral body with mild retropulsion of bone into the anterior thecal sac region. No fracture is appreciable in the cervical region. No blastic or lytic bone lesions are evident. Soft tissues and spinal canal: The prevertebral soft tissues and predental space regions are normal. There is no cord or canal hematoma evident. No paraspinous lesions. Disc levels: There is severe disc space narrowing at C5-6 and C6-7. There is milder disc space narrowing at C4-5. There are prominent anterior osteophytes at C5 and C6. There is facet hypertrophy at multiple levels bilaterally. There is exit foraminal narrowing at multiple levels, including at C3-4 on the right, at C4-5 on the left, at C5-6 bilaterally, and C6-7 bilaterally. No frank disc extrusion or stenosis is evident. Upper chest: There is aortic atherosclerosis. Visualized upper lung regions are clear. Other: There are foci of calcification in each carotid artery. IMPRESSION: CT head: Atrophy with periventricular small vessel disease. Prior small lacunar infarct in the genu of the left internal capsule. No acute infarct. No mass or hemorrhage. There are foci of arterial vascular  calcification. There are foci of paranasal sinus disease. CT cervical spine: Less than optimal study due to motion artifact. Wedge fracture of the T3 vertebral body, acute appearing, with evidence of mild retropulsion of bone posteriorly. This fracture appears acute. No acute appearing cervical fracture seen with limitations due to motion. Mild retrolisthesis of C6 on C7 is felt to be due to underlying spondylosis. Note that there is multilevel arthropathy with exit foraminal narrowing due to bony hypertrophy at multiple levels. There is aortic and carotid artery calcification. Aortic Atherosclerosis (ICD10-I70.0). Critical Value/emergent results were called by telephone at the time of interpretation on 11/16/2018 at 8:14 am to Dr. Alona BeneJOSHUA LONG , who verbally acknowledged these results. Electronically Signed   By: Bretta BangWilliam  Woodruff III M.D.   On: 11/16/2018 08:14   Ct Cervical Spine Wo  Contrast  Result Date: 11/16/2018 CLINICAL DATA:  Unwitnessed fall.  Alzheimer's dementia EXAM: CT HEAD WITHOUT CONTRAST CT CERVICAL SPINE WITHOUT CONTRAST TECHNIQUE: Multidetector CT imaging of the head and cervical spine was performed following the standard protocol without intravenous contrast. Multiplanar CT image reconstructions of the cervical spine were also generated. COMPARISON:  None. FINDINGS: CT HEAD FINDINGS Brain: There is moderate diffuse atrophy. There is no intracranial mass, hemorrhage, extra-axial fluid collection, or midline shift. There is patchy small vessel disease in the centra semiovale bilaterally. There is a small lacunar infarct in the genu of the left internal capsule. No acute appearing infarct is demonstrable on this study. Vascular: There is no hyperdense vessel. There is calcification in each carotid siphon region. Skull: The bony calvarium appears intact. There is frontal hyperostosis. Sinuses/Orbits: There is mucosal thickening in several ethmoid air cells as well as in portions of the right  maxillary antrum. There is also opacification in the inferomedial right frontal sinus. Orbits appear symmetric bilaterally except for evidence of cataract removal on the left. Other: Mastoid air cells are clear. CT CERVICAL SPINE FINDINGS Note that there is a degree of motion artifact. Alignment: There is 3 mm of retrolisthesis of C6 on C7. No other spondylolisthesis is evident. Skull base and vertebrae: Skull base and craniocervical junction regions appear normal. There is pannus posterior to the odontoid, not causing appreciable impression on the craniocervical junction. There is a wedge fracture of the T3 vertebral body with mild retropulsion of bone into the anterior thecal sac region. No fracture is appreciable in the cervical region. No blastic or lytic bone lesions are evident. Soft tissues and spinal canal: The prevertebral soft tissues and predental space regions are normal. There is no cord or canal hematoma evident. No paraspinous lesions. Disc levels: There is severe disc space narrowing at C5-6 and C6-7. There is milder disc space narrowing at C4-5. There are prominent anterior osteophytes at C5 and C6. There is facet hypertrophy at multiple levels bilaterally. There is exit foraminal narrowing at multiple levels, including at C3-4 on the right, at C4-5 on the left, at C5-6 bilaterally, and C6-7 bilaterally. No frank disc extrusion or stenosis is evident. Upper chest: There is aortic atherosclerosis. Visualized upper lung regions are clear. Other: There are foci of calcification in each carotid artery. IMPRESSION: CT head: Atrophy with periventricular small vessel disease. Prior small lacunar infarct in the genu of the left internal capsule. No acute infarct. No mass or hemorrhage. There are foci of arterial vascular calcification. There are foci of paranasal sinus disease. CT cervical spine: Less than optimal study due to motion artifact. Wedge fracture of the T3 vertebral body, acute appearing, with  evidence of mild retropulsion of bone posteriorly. This fracture appears acute. No acute appearing cervical fracture seen with limitations due to motion. Mild retrolisthesis of C6 on C7 is felt to be due to underlying spondylosis. Note that there is multilevel arthropathy with exit foraminal narrowing due to bony hypertrophy at multiple levels. There is aortic and carotid artery calcification. Aortic Atherosclerosis (ICD10-I70.0). Critical Value/emergent results were called by telephone at the time of interpretation on 11/16/2018 at 8:14 am to Dr. Alona Bene , who verbally acknowledged these results. Electronically Signed   By: Bretta Bang III M.D.   On: 11/16/2018 08:14   Ct Thoracic Spine Wo Contrast  Result Date: 11/16/2018 CLINICAL DATA:  Found down at nursing home. History of diabetes, Alzheimer's/dementia. T3 fracture on cervical spine CT. EXAM: CT THORACIC SPINE WITHOUT  CONTRAST TECHNIQUE: Multidetector CT images of the thoracic were obtained using the standard protocol without intravenous contrast. COMPARISON:  CT cervical spine same date. FINDINGS: Alignment: Normal. Vertebrae: As demonstrated on earlier cervical spine CT, there is an acute-appearing burst fracture involving the T3 vertebral body. This is associated with approximately 40% loss the vertebral body height centrally and 3 mm of osseous retropulsion. The posterior elements appear intact, and there is no widening of the interpedicular distance. No other acute osseous findings are demonstrated. Paraspinal and other soft tissues: Probable mild paraspinal hemorrhage at the level of the T3 fracture. No evidence of epidural hematoma. There is atherosclerosis of the aorta, great vessels and coronary arteries. There are dependent opacities in both lungs which likely represent atelectasis. Probable left renal cyst. Disc levels: Mild degenerative changes throughout the visualized spine with posterior osteophytes. There is a small central disc  protrusion at T6-7. No large disc herniation or high-grade spinal stenosis identified. IMPRESSION: 1. Acute burst fracture at T3 with 3 mm of osseous retropulsion. 2. No other acute findings within the thoracic spine. 3. Mild thoracic spondylosis.  Small disc protrusion at T6-7. Electronically Signed   By: Carey BullocksWilliam  Veazey M.D.   On: 11/16/2018 08:56    EKG: Independently reviewed.    Assessment/Plan Principal Problem:   Burst fracture of thoracic vertebra, closed, initial encounter (HCC) Admit to MedSurg/inpatient. No surgical intervention needed per neurosurgery. They have recommended strict bedrest for 2 to 3 days. Oxycodone 2.5 mg every 3 hours as needed. Morphine 2 mg IVP every 3 hours as needed for pain x2 doses. Toradol 15 mg IVP every 8 hours x 6 doses. Will consult PT for evaluation on Sunday or Monday.  Active Problems:   Dementia (HCC) Continue on Namenda and Aricept.    Type 2 diabetes mellitus (HCC) Check hemoglobin A1c. Carbohydrate modified diet. CBG monitoring with regular insulin sliding scale.    Hypocalcemia Start calcium plus vitamin D oral replacement. Check vitamin D level.    Major depression Continue Zoloft 50 mg p.o. daily.    DVT prophylaxis: Lovenox SQ. Code Status: Full code. Family Communication: None at bedside. Disposition Plan: Admit for pain control.  PT evaluation in 2 to 3 days. Consults called: Admission status: Inpatient/MedSurg.   Bobette Moavid Manuel Keirah Konitzer MD [facility]  If 7PM-7AM, please contact night-coverage [email or web address] Password [email or web address]  11/16/2018, 11:30 AM

## 2018-11-16 NOTE — ED Triage Notes (Signed)
[facility]. Pt c/o mid back pain but no deformity or step off noted at scene. IV site established and 25 mcg Fentanyl given x4 doses for a total dose of 100 mcg. Pt reports relief.

## 2018-11-16 NOTE — Progress Notes (Signed)
Reason for Consult: T3 fracture Referring Physician: Dr. Alona Bene  Consulted by Dr. Jacqulyn Bath by telephone, reviewed history with Dr. Jacqulyn Bath and his exam of the patient, reviewed the patient's CT images.  Katherine Wilson is an 84 y.o. female.  HPI: Elderly resident of a SNF, with a history of dementia (which limited Dr. Edwin Dada history), who apparently fell, and has been having complaints of upper back pain.  Transferred to [facility] and evaluated by Dr. Jacqulyn Bath, including CT of the T-spine which revealed a T3 fracture.  The fracture has minimal retropulsion, but no canal stenosis.  Dr. Jacqulyn Bath reported no neurologic deficits other than the dementia, including no weakness of the lower extremities.  Past Medical History:  Past Medical History:  Diagnosis Date  . Alzheimer disease (HCC)   . Dementia (HCC)   . Diabetes mellitus without complication (HCC)   . Major depression   . Volvulus (HCC)    Allergies:  Allergies  Allergen Reactions  . Chicken Allergy Other (See Comments)    Per MAR - unknown reaction  . Eggs Or Egg-Derived Products Other (See Comments)    Per MAR - unknown reaction  . Latex Other (See Comments)    Per MAR - unknown reaction    Medications: I have reviewed the patient's current medications.  Dg Chest 2 View  Result Date: 11/16/2018 CLINICAL DATA:  Chest pain EXAM: CHEST - 2 VIEW COMPARISON:  None. FINDINGS: The patient's mandible obscures portions of the apices. There is atelectatic change in the left lower lobe. Visualized lungs elsewhere clear. Heart size and pulmonary vascularity are normal. No adenopathy evident. Aorta is prominent. No bone lesions evident. IMPRESSION: Note that patient's mandible obscures portions of the apices. There is atelectatic change in the left lower lobe. Visualized lungs elsewhere clear. Heart size normal. Prominence of the aorta may reflect chronic hypertensive change. Electronically Signed   By: Bretta Bang III M.D.   On: 11/16/2018 08:00    Ct Head Wo Contrast  Result Date: 11/16/2018 CLINICAL DATA:  Unwitnessed fall.  Alzheimer's dementia EXAM: CT HEAD WITHOUT CONTRAST CT CERVICAL SPINE WITHOUT CONTRAST TECHNIQUE: Multidetector CT imaging of the head and cervical spine was performed following the standard protocol without intravenous contrast. Multiplanar CT image reconstructions of the cervical spine were also generated. COMPARISON:  None. FINDINGS: CT HEAD FINDINGS Brain: There is moderate diffuse atrophy. There is no intracranial mass, hemorrhage, extra-axial fluid collection, or midline shift. There is patchy small vessel disease in the centra semiovale bilaterally. There is a small lacunar infarct in the genu of the left internal capsule. No acute appearing infarct is demonstrable on this study. Vascular: There is no hyperdense vessel. There is calcification in each carotid siphon region. Skull: The bony calvarium appears intact. There is frontal hyperostosis. Sinuses/Orbits: There is mucosal thickening in several ethmoid air cells as well as in portions of the right maxillary antrum. There is also opacification in the inferomedial right frontal sinus. Orbits appear symmetric bilaterally except for evidence of cataract removal on the left. Other: Mastoid air cells are clear. CT CERVICAL SPINE FINDINGS Note that there is a degree of motion artifact. Alignment: There is 3 mm of retrolisthesis of C6 on C7. No other spondylolisthesis is evident. Skull base and vertebrae: Skull base and craniocervical junction regions appear normal. There is pannus posterior to the odontoid, not causing appreciable impression on the craniocervical junction. There is a wedge fracture of the T3 vertebral body with mild retropulsion of bone into the anterior thecal sac  region. No fracture is appreciable in the cervical region. No blastic or lytic bone lesions are evident. Soft tissues and spinal canal: The prevertebral soft tissues and predental space regions are  normal. There is no cord or canal hematoma evident. No paraspinous lesions. Disc levels: There is severe disc space narrowing at C5-6 and C6-7. There is milder disc space narrowing at C4-5. There are prominent anterior osteophytes at C5 and C6. There is facet hypertrophy at multiple levels bilaterally. There is exit foraminal narrowing at multiple levels, including at C3-4 on the right, at C4-5 on the left, at C5-6 bilaterally, and C6-7 bilaterally. No frank disc extrusion or stenosis is evident. Upper chest: There is aortic atherosclerosis. Visualized upper lung regions are clear. Other: There are foci of calcification in each carotid artery. IMPRESSION: CT head: Atrophy with periventricular small vessel disease. Prior small lacunar infarct in the genu of the left internal capsule. No acute infarct. No mass or hemorrhage. There are foci of arterial vascular calcification. There are foci of paranasal sinus disease. CT cervical spine: Less than optimal study due to motion artifact. Wedge fracture of the T3 vertebral body, acute appearing, with evidence of mild retropulsion of bone posteriorly. This fracture appears acute. No acute appearing cervical fracture seen with limitations due to motion. Mild retrolisthesis of C6 on C7 is felt to be due to underlying spondylosis. Note that there is multilevel arthropathy with exit foraminal narrowing due to bony hypertrophy at multiple levels. There is aortic and carotid artery calcification. Aortic Atherosclerosis (ICD10-I70.0). Critical Value/emergent results were called by telephone at the time of interpretation on 11/16/2018 at 8:14 am to Dr. Alona BeneJOSHUA LONG , who verbally acknowledged these results. Electronically Signed   By: Bretta BangWilliam  Woodruff III M.D.   On: 11/16/2018 08:14   Ct Cervical Spine Wo Contrast  Result Date: 11/16/2018 CLINICAL DATA:  Unwitnessed fall.  Alzheimer's dementia EXAM: CT HEAD WITHOUT CONTRAST CT CERVICAL SPINE WITHOUT CONTRAST TECHNIQUE:  Multidetector CT imaging of the head and cervical spine was performed following the standard protocol without intravenous contrast. Multiplanar CT image reconstructions of the cervical spine were also generated. COMPARISON:  None. FINDINGS: CT HEAD FINDINGS Brain: There is moderate diffuse atrophy. There is no intracranial mass, hemorrhage, extra-axial fluid collection, or midline shift. There is patchy small vessel disease in the centra semiovale bilaterally. There is a small lacunar infarct in the genu of the left internal capsule. No acute appearing infarct is demonstrable on this study. Vascular: There is no hyperdense vessel. There is calcification in each carotid siphon region. Skull: The bony calvarium appears intact. There is frontal hyperostosis. Sinuses/Orbits: There is mucosal thickening in several ethmoid air cells as well as in portions of the right maxillary antrum. There is also opacification in the inferomedial right frontal sinus. Orbits appear symmetric bilaterally except for evidence of cataract removal on the left. Other: Mastoid air cells are clear. CT CERVICAL SPINE FINDINGS Note that there is a degree of motion artifact. Alignment: There is 3 mm of retrolisthesis of C6 on C7. No other spondylolisthesis is evident. Skull base and vertebrae: Skull base and craniocervical junction regions appear normal. There is pannus posterior to the odontoid, not causing appreciable impression on the craniocervical junction. There is a wedge fracture of the T3 vertebral body with mild retropulsion of bone into the anterior thecal sac region. No fracture is appreciable in the cervical region. No blastic or lytic bone lesions are evident. Soft tissues and spinal canal: The prevertebral soft tissues and predental  space regions are normal. There is no cord or canal hematoma evident. No paraspinous lesions. Disc levels: There is severe disc space narrowing at C5-6 and C6-7. There is milder disc space narrowing at  C4-5. There are prominent anterior osteophytes at C5 and C6. There is facet hypertrophy at multiple levels bilaterally. There is exit foraminal narrowing at multiple levels, including at C3-4 on the right, at C4-5 on the left, at C5-6 bilaterally, and C6-7 bilaterally. No frank disc extrusion or stenosis is evident. Upper chest: There is aortic atherosclerosis. Visualized upper lung regions are clear. Other: There are foci of calcification in each carotid artery. IMPRESSION: CT head: Atrophy with periventricular small vessel disease. Prior small lacunar infarct in the genu of the left internal capsule. No acute infarct. No mass or hemorrhage. There are foci of arterial vascular calcification. There are foci of paranasal sinus disease. CT cervical spine: Less than optimal study due to motion artifact. Wedge fracture of the T3 vertebral body, acute appearing, with evidence of mild retropulsion of bone posteriorly. This fracture appears acute. No acute appearing cervical fracture seen with limitations due to motion. Mild retrolisthesis of C6 on C7 is felt to be due to underlying spondylosis. Note that there is multilevel arthropathy with exit foraminal narrowing due to bony hypertrophy at multiple levels. There is aortic and carotid artery calcification. Aortic Atherosclerosis (ICD10-I70.0). Critical Value/emergent results were called by telephone at the time of interpretation on 11/16/2018 at 8:14 am to Dr. Alona BeneJOSHUA LONG , who verbally acknowledged these results. Electronically Signed   By: Bretta BangWilliam  Woodruff III M.D.   On: 11/16/2018 08:14   Ct Thoracic Spine Wo Contrast  Result Date: 11/16/2018 CLINICAL DATA:  Found down at nursing home. History of diabetes, Alzheimer's/dementia. T3 fracture on cervical spine CT. EXAM: CT THORACIC SPINE WITHOUT CONTRAST TECHNIQUE: Multidetector CT images of the thoracic were obtained using the standard protocol without intravenous contrast. COMPARISON:  CT cervical spine same  date. FINDINGS: Alignment: Normal. Vertebrae: As demonstrated on earlier cervical spine CT, there is an acute-appearing burst fracture involving the T3 vertebral body. This is associated with approximately 40% loss the vertebral body height centrally and 3 mm of osseous retropulsion. The posterior elements appear intact, and there is no widening of the interpedicular distance. No other acute osseous findings are demonstrated. Paraspinal and other soft tissues: Probable mild paraspinal hemorrhage at the level of the T3 fracture. No evidence of epidural hematoma. There is atherosclerosis of the aorta, great vessels and coronary arteries. There are dependent opacities in both lungs which likely represent atelectasis. Probable left renal cyst. Disc levels: Mild degenerative changes throughout the visualized spine with posterior osteophytes. There is a small central disc protrusion at T6-7. No large disc herniation or high-grade spinal stenosis identified. IMPRESSION: 1. Acute burst fracture at T3 with 3 mm of osseous retropulsion. 2. No other acute findings within the thoracic spine. 3. Mild thoracic spondylosis.  Small disc protrusion at T6-7. Electronically Signed   By: Carey BullocksWilliam  Veazey M.D.   On: 11/16/2018 08:56    Assessment/Plan: Elderly patient with dementia, resident of a SNF, who fell and suffered a T3 fracture.  I reviewed the history and images, and discussed the case with Dr. Jacqulyn BathLong.  There is no role for neurosurgical intervention or follow-up.  We discussed that the management will be nonsurgical including bedrest for 2 to 3 days, and then progressive mobilization with PT/OT, with ultimate disposition to return to the SNF.  Her management will be through the hospitalist  service, with pain management as necessary.  The upper spine, rib cage, and sternum should provide the necessary bracing, and there is no role for external orthosis.    Hewitt Shorts, MD 11/16/2018, 10:43 AM

## 2018-11-16 NOTE — ED Notes (Signed)
Report given, pt ready for transport to 1332.

## 2018-11-17 ENCOUNTER — Other Ambulatory Visit: Payer: Self-pay

## 2018-11-17 DIAGNOSIS — S22031A Stable burst fracture of third thoracic vertebra, initial encounter for closed fracture: Secondary | ICD-10-CM | POA: Diagnosis not present

## 2018-11-17 DIAGNOSIS — S22001A Stable burst fracture of unspecified thoracic vertebra, initial encounter for closed fracture: Secondary | ICD-10-CM | POA: Diagnosis not present

## 2018-11-17 LAB — GLUCOSE, CAPILLARY
GLUCOSE-CAPILLARY: 184 mg/dL — AB (ref 70–99)
Glucose-Capillary: 114 mg/dL — ABNORMAL HIGH (ref 70–99)
Glucose-Capillary: 182 mg/dL — ABNORMAL HIGH (ref 70–99)
Glucose-Capillary: 121 mg/dL — ABNORMAL HIGH (ref 70–99)

## 2018-11-17 LAB — VITAMIN D 25 HYDROXY (VIT D DEFICIENCY, FRACTURES): Vit D, 25-Hydroxy: 26 ng/mL — ABNORMAL LOW (ref 30.0–100.0)

## 2018-11-17 MED ORDER — ACETAMINOPHEN 325 MG PO TABS
650.0000 mg | ORAL_TABLET | Freq: Four times a day (QID) | ORAL | Status: DC
  Administered 2018-11-17 – 2018-11-19 (×8): 650 mg via ORAL
  Filled 2018-11-17 (×8): qty 2

## 2018-11-17 MED ORDER — OXYCODONE HCL 5 MG PO TABS: 2.5000 mg | ORAL_TABLET | ORAL | Status: DC | PRN

## 2018-11-17 NOTE — Progress Notes (Signed)
Pt continuously getting out of bed, presenting high fall risk. Order placed for safety sitter. Katherine Wilson, [name]

## 2018-11-17 NOTE — Progress Notes (Signed)
PROGRESS NOTE    [place] Luddy  MVH:[id number] DOB: Apr 30, 1923 DOA: 11/16/2018 PCP: System, Pcp Not In    Brief Narrative:84 y.o. female with medical history significant of Alzheimer's disease, type 2 diabetes, major depression, history of volvulus who was transferred to the emergency department after having a fall due to an unknown mechanism at her nursing home facility.  Patient knows that she fell, but is unable to elaborate further on what happened.  She complains of pain in her back, but denies any other acute complaints.  Review of system is limited by the patient's dementia.  ED Course: Temperature 97.9 F, pulse 66, respirations 14, blood pressure 136/65 mmHg and O2 sat 97% on room air.  No meds were ordered in the ED.  I added morphine as needed and Toradol.  White count was 8.8, hemoglobin 12.5 g/dL and platelets 841.  CMP shows a glucose of 149 and a calcium level of 8.4 mg/dL, all other values are within normal limits.  Imaging: CT head did not show any acute abnormalities.  There is a  prior lacunar infarct in the genu of the left internal capsule.  Cervical spine shows DDD changes, but no acute fractures.  However, CT thoracic spine shows an acute burst fracture of T3 with 3 mm abolishes retropulsion.  There were no other acute findings.  There is a small disc protrusion at T6-7.  Her chest radiograph shows LLL atelectasis.  Otherwise, there is no other abnormal change.  Please see images and full radiology report for further detail.   Assessment & Plan:   Principal Problem:   Burst fracture of thoracic vertebra, closed, initial encounter (HCC) Active Problems:   Dementia (HCC)   Type 2 diabetes mellitus (HCC)   Hypocalcemia   Major depression      T3 FRACTURE  vertebra, closed- No surgical intervention needed per neurosurgery. They have recommended s bedrest for 2 to 3 days. Oxycodone 2.5 mg every 3 hours as needed. Patient pulled out her peripheral IV last night  will change pain management through p.o.  Await PT evaluation. Active Problems:   Dementia (HCC) Continue on Namenda and Aricept.    Type 2 diabetes mellitus (HCC) Carbohydrate modified diet. CBG monitoring with regular insulin sliding scale.    Hypocalcemia Start calcium plus vitamin D oral replacement.    Major depression Continue Zoloft 50 mg p.o. daily.   Estimated body mass index is 18.53 kg/m as calculated from the following:   Height as of this encounter: 5\' 2"  (1.575 m).   Weight as of this encounter: 45.9 kg.  DVT prophylaxis: LOVENOX Code Status:FULL Family Communication:NONE Disposition Plan: PENDING PT EVAL LIKELY NEED SNF Consultants:   ED physician discussed with neurosurgery over the phone Procedures: None Antimicrobials none   Subjective: Patient resting in bed appears to be no acute distress but very confused staff reports she pulled out her IV line last night   Objective: Vitals:   11/16/18 1230 11/16/18 1412 11/16/18 2128 11/17/18 0530  BP: 129/67 125/60 (!) 114/54 125/61  Pulse: 71 61 71 66  Resp: 16 16 (!) 22 18  Temp: 98.6 F (37 C) 98 F (36.7 C) 98.6 F (37 C) 98.2 F (36.8 C)  TempSrc: Oral Oral Oral Oral  SpO2: 98% 96% 93% 92%  Weight:   45.9 kg   Height: 5\' 2"  (1.575 m)       Intake/Output Summary (Last 24 hours) at 11/17/2018 1212 Last data filed at 11/17/2018 1041 Gross per 24 hour  Intake 120 ml  Output 200 ml  Net -80 ml   Filed Weights   11/16/18 2128  Weight: 45.9 kg    Examination:  General exam: Appears calm and comfortable  Respiratory system: Clear to auscultation. Respiratory effort normal. Cardiovascular system: S1 & S2 heard, RRR. No JVD, murmurs, rubs, gallops or clicks. No pedal edema. Gastrointestinal system: Abdomen is nondistended, soft and nontender. No organomegaly or masses felt. Normal bowel sounds heard. Central nervous system: Alert and oriented. No focal neurological deficits. Extremities:  Symmetric 5 x 5 power. Skin: No rashes, lesions or ulcers Psychiatry: Judgement and insight appear normal. Mood & affect appropriate.     Data Reviewed: I have personally reviewed following labs and imaging studies  CBC: Recent Labs  Lab 11/16/18 1041  WBC 8.8  NEUTROABS 7.5  HGB 12.5  HCT 39.3  MCV 94.7  PLT 149*   Basic Metabolic Panel: Recent Labs  Lab 11/16/18 1041  NA 139  K 3.6  CL 105  CO2 24  GLUCOSE 149*  BUN 12  CREATININE 0.50  CALCIUM 8.4*  MG 2.0  PHOS 3.1   GFR: Estimated Creatinine Clearance: 31.8 mL/min (by C-G formula based on SCr of 0.5 mg/dL). Liver Function Tests: Recent Labs  Lab 11/16/18 1041  AST 25  ALT 21  ALKPHOS 49  BILITOT 1.1  PROT 6.5  ALBUMIN 3.8   No results for input(s): LIPASE, AMYLASE in the last 168 hours. No results for input(s): AMMONIA in the last 168 hours. Coagulation Profile: No results for input(s): INR, PROTIME in the last 168 hours. Cardiac Enzymes: No results for input(s): CKTOTAL, CKMB, CKMBINDEX, TROPONINI in the last 168 hours. BNP (last 3 results) No results for input(s): PROBNP in the last 8760 hours. HbA1C: Recent Labs    11/16/18 1041  HGBA1C 6.1*   CBG: Recent Labs  Lab 11/16/18 1621 11/16/18 2133 11/17/18 0823  GLUCAP 131* 138* 114*   Lipid Profile: No results for input(s): CHOL, HDL, LDLCALC, TRIG, CHOLHDL, LDLDIRECT in the last 72 hours. Thyroid Function Tests: No results for input(s): TSH, T4TOTAL, FREET4, T3FREE, THYROIDAB in the last 72 hours. Anemia Panel: No results for input(s): VITAMINB12, FOLATE, FERRITIN, TIBC, IRON, RETICCTPCT in the last 72 hours. Sepsis Labs: No results for input(s): PROCALCITON, LATICACIDVEN in the last 168 hours.  Recent Results (from the past 240 hour(s))  MRSA PCR Screening     Status: None   Collection Time: 11/16/18  8:39 PM  Result Value Ref Range Status   MRSA by PCR NEGATIVE NEGATIVE Final    Comment:        The GeneXpert MRSA Assay  (FDA approved for NASAL specimens only), is one component of a comprehensive MRSA colonization surveillance program. It is not intended to diagnose MRSA infection nor to guide or monitor treatment for MRSA infections. Performed at [facility], [address] [place], [place] [address]          Radiology Studies: Dg Chest 2 View  Result Date: 11/16/2018 CLINICAL DATA:  Chest pain EXAM: CHEST - 2 VIEW COMPARISON:  None. FINDINGS: The patient's mandible obscures portions of the apices. There is atelectatic change in the left lower lobe. Visualized lungs elsewhere clear. Heart size and pulmonary vascularity are normal. No adenopathy evident. Aorta is prominent. No bone lesions evident. IMPRESSION: Note that patient's mandible obscures portions of the apices. There is atelectatic change in the left lower lobe. Visualized lungs elsewhere clear. Heart size normal. Prominence of the aorta may  reflect chronic hypertensive change. Electronically Signed   By: Bretta Bang III M.D.   On: 11/16/2018 08:00   Ct Head Wo Contrast  Result Date: 11/16/2018 CLINICAL DATA:  Unwitnessed fall.  Alzheimer's dementia EXAM: CT HEAD WITHOUT CONTRAST CT CERVICAL SPINE WITHOUT CONTRAST TECHNIQUE: Multidetector CT imaging of the head and cervical spine was performed following the standard protocol without intravenous contrast. Multiplanar CT image reconstructions of the cervical spine were also generated. COMPARISON:  None. FINDINGS: CT HEAD FINDINGS Brain: There is moderate diffuse atrophy. There is no intracranial mass, hemorrhage, extra-axial fluid collection, or midline shift. There is patchy small vessel disease in the centra semiovale bilaterally. There is a small lacunar infarct in the genu of the left internal capsule. No acute appearing infarct is demonstrable on this study. Vascular: There is no hyperdense vessel. There is calcification in each carotid siphon region. Skull: The bony  calvarium appears intact. There is frontal hyperostosis. Sinuses/Orbits: There is mucosal thickening in several ethmoid air cells as well as in portions of the right maxillary antrum. There is also opacification in the inferomedial right frontal sinus. Orbits appear symmetric bilaterally except for evidence of cataract removal on the left. Other: Mastoid air cells are clear. CT CERVICAL SPINE FINDINGS Note that there is a degree of motion artifact. Alignment: There is 3 mm of retrolisthesis of C6 on C7. No other spondylolisthesis is evident. Skull base and vertebrae: Skull base and craniocervical junction regions appear normal. There is pannus posterior to the odontoid, not causing appreciable impression on the craniocervical junction. There is a wedge fracture of the T3 vertebral body with mild retropulsion of bone into the anterior thecal sac region. No fracture is appreciable in the cervical region. No blastic or lytic bone lesions are evident. Soft tissues and spinal canal: The prevertebral soft tissues and predental space regions are normal. There is no cord or canal hematoma evident. No paraspinous lesions. Disc levels: There is severe disc space narrowing at C5-6 and C6-7. There is milder disc space narrowing at C4-5. There are prominent anterior osteophytes at C5 and C6. There is facet hypertrophy at multiple levels bilaterally. There is exit foraminal narrowing at multiple levels, including at C3-4 on the right, at C4-5 on the left, at C5-6 bilaterally, and C6-7 bilaterally. No frank disc extrusion or stenosis is evident. Upper chest: There is aortic atherosclerosis. Visualized upper lung regions are clear. Other: There are foci of calcification in each carotid artery. IMPRESSION: CT head: Atrophy with periventricular small vessel disease. Prior small lacunar infarct in the genu of the left internal capsule. No acute infarct. No mass or hemorrhage. There are foci of arterial vascular calcification. There  are foci of paranasal sinus disease. CT cervical spine: Less than optimal study due to motion artifact. Wedge fracture of the T3 vertebral body, acute appearing, with evidence of mild retropulsion of bone posteriorly. This fracture appears acute. No acute appearing cervical fracture seen with limitations due to motion. Mild retrolisthesis of C6 on C7 is felt to be due to underlying spondylosis. Note that there is multilevel arthropathy with exit foraminal narrowing due to bony hypertrophy at multiple levels. There is aortic and carotid artery calcification. Aortic Atherosclerosis (ICD10-I70.0). Critical Value/emergent results were called by telephone at the time of interpretation on 11/16/2018 at 8:14 am to Dr. Alona Bene , who verbally acknowledged these results. Electronically Signed   By: Bretta Bang III M.D.   On: 11/16/2018 08:14   Ct Cervical Spine Wo Contrast  Result Date:  11/16/2018 CLINICAL DATA:  Unwitnessed fall.  Alzheimer's dementia EXAM: CT HEAD WITHOUT CONTRAST CT CERVICAL SPINE WITHOUT CONTRAST TECHNIQUE: Multidetector CT imaging of the head and cervical spine was performed following the standard protocol without intravenous contrast. Multiplanar CT image reconstructions of the cervical spine were also generated. COMPARISON:  None. FINDINGS: CT HEAD FINDINGS Brain: There is moderate diffuse atrophy. There is no intracranial mass, hemorrhage, extra-axial fluid collection, or midline shift. There is patchy small vessel disease in the centra semiovale bilaterally. There is a small lacunar infarct in the genu of the left internal capsule. No acute appearing infarct is demonstrable on this study. Vascular: There is no hyperdense vessel. There is calcification in each carotid siphon region. Skull: The bony calvarium appears intact. There is frontal hyperostosis. Sinuses/Orbits: There is mucosal thickening in several ethmoid air cells as well as in portions of the right maxillary antrum. There  is also opacification in the inferomedial right frontal sinus. Orbits appear symmetric bilaterally except for evidence of cataract removal on the left. Other: Mastoid air cells are clear. CT CERVICAL SPINE FINDINGS Note that there is a degree of motion artifact. Alignment: There is 3 mm of retrolisthesis of C6 on C7. No other spondylolisthesis is evident. Skull base and vertebrae: Skull base and craniocervical junction regions appear normal. There is pannus posterior to the odontoid, not causing appreciable impression on the craniocervical junction. There is a wedge fracture of the T3 vertebral body with mild retropulsion of bone into the anterior thecal sac region. No fracture is appreciable in the cervical region. No blastic or lytic bone lesions are evident. Soft tissues and spinal canal: The prevertebral soft tissues and predental space regions are normal. There is no cord or canal hematoma evident. No paraspinous lesions. Disc levels: There is severe disc space narrowing at C5-6 and C6-7. There is milder disc space narrowing at C4-5. There are prominent anterior osteophytes at C5 and C6. There is facet hypertrophy at multiple levels bilaterally. There is exit foraminal narrowing at multiple levels, including at C3-4 on the right, at C4-5 on the left, at C5-6 bilaterally, and C6-7 bilaterally. No frank disc extrusion or stenosis is evident. Upper chest: There is aortic atherosclerosis. Visualized upper lung regions are clear. Other: There are foci of calcification in each carotid artery. IMPRESSION: CT head: Atrophy with periventricular small vessel disease. Prior small lacunar infarct in the genu of the left internal capsule. No acute infarct. No mass or hemorrhage. There are foci of arterial vascular calcification. There are foci of paranasal sinus disease. CT cervical spine: Less than optimal study due to motion artifact. Wedge fracture of the T3 vertebral body, acute appearing, with evidence of mild  retropulsion of bone posteriorly. This fracture appears acute. No acute appearing cervical fracture seen with limitations due to motion. Mild retrolisthesis of C6 on C7 is felt to be due to underlying spondylosis. Note that there is multilevel arthropathy with exit foraminal narrowing due to bony hypertrophy at multiple levels. There is aortic and carotid artery calcification. Aortic Atherosclerosis (ICD10-I70.0). Critical Value/emergent results were called by telephone at the time of interpretation on 11/16/2018 at 8:14 am to Dr. Alona BeneJOSHUA LONG , who verbally acknowledged these results. Electronically Signed   By: Bretta BangWilliam  Woodruff III M.D.   On: 11/16/2018 08:14   Ct Thoracic Spine Wo Contrast  Result Date: 11/16/2018 CLINICAL DATA:  Found down at nursing home. History of diabetes, Alzheimer's/dementia. T3 fracture on cervical spine CT. EXAM: CT THORACIC SPINE WITHOUT CONTRAST TECHNIQUE: Multidetector CT  images of the thoracic were obtained using the standard protocol without intravenous contrast. COMPARISON:  CT cervical spine same date. FINDINGS: Alignment: Normal. Vertebrae: As demonstrated on earlier cervical spine CT, there is an acute-appearing burst fracture involving the T3 vertebral body. This is associated with approximately 40% loss the vertebral body height centrally and 3 mm of osseous retropulsion. The posterior elements appear intact, and there is no widening of the interpedicular distance. No other acute osseous findings are demonstrated. Paraspinal and other soft tissues: Probable mild paraspinal hemorrhage at the level of the T3 fracture. No evidence of epidural hematoma. There is atherosclerosis of the aorta, great vessels and coronary arteries. There are dependent opacities in both lungs which likely represent atelectasis. Probable left renal cyst. Disc levels: Mild degenerative changes throughout the visualized spine with posterior osteophytes. There is a small central disc protrusion at  T6-7. No large disc herniation or high-grade spinal stenosis identified. IMPRESSION: 1. Acute burst fracture at T3 with 3 mm of osseous retropulsion. 2. No other acute findings within the thoracic spine. 3. Mild thoracic spondylosis.  Small disc protrusion at T6-7. Electronically Signed   By: Carey Bullocks M.D.   On: 11/16/2018 08:56        Scheduled Meds: . amitriptyline  12.5 mg Oral QHS  . donepezil  10 mg Oral QHS  . enoxaparin (LOVENOX) injection  30 mg Subcutaneous Q24H  . famotidine  20 mg Oral Daily  . feeding supplement (ENSURE ENLIVE)  237 mL Oral QPC supper  . insulin aspart  0-9 Units Subcutaneous TID WC  . ketorolac  15 mg Intravenous Once  . ketorolac  15 mg Intravenous Q8H  . memantine  10 mg Oral BID  . sertraline  25 mg Oral Daily   Continuous Infusions:   LOS: 1 day    Alwyn Ren, MD [facility] If 7PM-7AM, please contact night-coverage [email or web address] Password [email or web address] 11/17/2018, 12:12 PM

## 2018-11-17 NOTE — Plan of Care (Signed)
Plan of care reviewed. 

## 2018-11-18 DIAGNOSIS — S22001A Stable burst fracture of unspecified thoracic vertebra, initial encounter for closed fracture: Secondary | ICD-10-CM | POA: Diagnosis not present

## 2018-11-18 DIAGNOSIS — S22031A Stable burst fracture of third thoracic vertebra, initial encounter for closed fracture: Secondary | ICD-10-CM | POA: Diagnosis not present

## 2018-11-18 LAB — GLUCOSE, CAPILLARY
Glucose-Capillary: 127 mg/dL — ABNORMAL HIGH (ref 70–99)
Glucose-Capillary: 92 mg/dL (ref 70–99)
Glucose-Capillary: 113 mg/dL — ABNORMAL HIGH (ref 70–99)
Glucose-Capillary: 154 mg/dL — ABNORMAL HIGH (ref 70–99)

## 2018-11-18 NOTE — Progress Notes (Signed)
PROGRESS NOTE    [place] Kopper  KGM:[id number] DOB: 12/11/1922 DOA: 11/16/2018 PCP: System, Pcp Not In  Brief Narrative:84 y.o.femalewith medical history significant ofAlzheimer's disease, type 2 diabetes, major depression, history of volvulus who was transferred to the emergency department after having a fall due to an unknown mechanism at her nursing home facility. Patient knows that she fell, but is unable to elaborate further on what happened. She complains of pain in her back, but denies any other acute complaints. Review of system is limited by the patient's dementia.  ED Course:Temperature 97.9 F, pulse 66, respirations 14, blood pressure 136/65 mmHg and O2 sat 97% on room air. No meds were ordered in the ED. I added morphine as needed and Toradol.  White count was 8.8, hemoglobin 12.5 g/dL and platelets [id number]. CMP shows a glucose of 149 and a calcium level of 8.4 mg/dL, all other values are within normal limits.  Imaging:CT head did not show any acute abnormalities. There is a prior lacunar infarct in the genu of the left internal capsule. Cervical spine shows DDD changes, but no acute fractures. However, CT thoracic spine shows an acute burst fracture of T3 with 3 mm abolishes retropulsion. There were no other acute findings. There is a small disc protrusion at T6-7. Her chest radiograph shows LLL atelectasis. Otherwise, there is no other abnormal change. Please see images and full radiology report for further detail.    Assessment & Plan:   Principal Problem:   Burst fracture of thoracic vertebra, closed, initial encounter (HCC) Active Problems:   Dementia (HCC)   Type 2 diabetes mellitus (HCC)   Hypocalcemia   Major depression   T3 FRACTURE  vertebra, closed- No surgical intervention needed per neurosurgery. They have recommended s bedrest for 2 to 3 days. Oxycodone 2.5 mg every 3 hours as needed. Patient pulled out her peripheral IV last night will  change pain management through p.o. she most likely will need placement. Await PT evaluation. Active Problems: Dementia (HCC) Continue on Namenda and Aricept.  Type 2 diabetes mellitus (HCC) Carbohydrate modified diet. CBG monitoring with regular insulin sliding scale.  Hypocalcemia Start calcium plus vitamin D oral replacement.  Major depression Continue Zoloft 50 mg p.o. daily.   Estimated body mass index is 18.53 kg/m as calculated from the following:   Height as of this encounter: 5\' 2"  (1.575 m).   Weight as of this encounter: 45.9 kg.  DVT prophylaxis: Lovenox Code Status: Full code Family Communication: No family available Disposition Plan:  Pending's PT evaluation most likely SNF placement Consultants:   Neurosurgery was consulted by ED physician over the phone  Procedures: None Antimicrobials: None Subjective: Resting in bed in no acute distress confused at baseline  Objective: Vitals:   11/17/18 0530 11/17/18 1503 11/17/18 2110 11/18/18 0654  BP: 125/61 125/61 (!) 143/62 134/60  Pulse: 66 69 71 70  Resp: 18 14 15 14   Temp: 98.2 F (36.8 C) 98.2 F (36.8 C) 98.2 F (36.8 C) 98.7 F (37.1 C)  TempSrc: Oral  Oral Oral  SpO2: 92% 96% 96% 93%  Weight:      Height:        Intake/Output Summary (Last 24 hours) at 11/18/2018 1209 Last data filed at 11/18/2018 1000 Gross per 24 hour  Intake 410 ml  Output 300 ml  Net 110 ml   Filed Weights   11/16/18 2128  Weight: 45.9 kg    Examination:  General exam: Appears calm and comfortable sleeping Respiratory system: Clear  to auscultation. Respiratory effort normal. Cardiovascular system: S1 & S2 heard, RRR. No JVD, murmurs, rubs, gallops or clicks. No pedal edema. Gastrointestinal system: Abdomen is nondistended, soft and nontender. No organomegaly or masses felt. Normal bowel sounds heard. Central nervous system: Alert and oriented. No focal neurological deficits. Extremities: Symmetric 5  x 5 power. Skin: No rashes, lesions or ulcers Psychiatry: Judgement and insight appear normal. Mood & affect appropriate.     Data Reviewed: I have personally reviewed following labs and imaging studies  CBC: Recent Labs  Lab 11/16/18 1041  WBC 8.8  NEUTROABS 7.5  HGB 12.5  HCT 39.3  MCV 94.7  PLT 149*   Basic Metabolic Panel: Recent Labs  Lab 11/16/18 1041  NA 139  K 3.6  CL 105  CO2 24  GLUCOSE 149*  BUN 12  CREATININE 0.50  CALCIUM 8.4*  MG 2.0  PHOS 3.1   GFR: Estimated Creatinine Clearance: 31.8 mL/min (by C-G formula based on SCr of 0.5 mg/dL). Liver Function Tests: Recent Labs  Lab 11/16/18 1041  AST 25  ALT 21  ALKPHOS 49  BILITOT 1.1  PROT 6.5  ALBUMIN 3.8   No results for input(s): LIPASE, AMYLASE in the last 168 hours. No results for input(s): AMMONIA in the last 168 hours. Coagulation Profile: No results for input(s): INR, PROTIME in the last 168 hours. Cardiac Enzymes: No results for input(s): CKTOTAL, CKMB, CKMBINDEX, TROPONINI in the last 168 hours. BNP (last 3 results) No results for input(s): PROBNP in the last 8760 hours. HbA1C: Recent Labs    11/16/18 1041  HGBA1C 6.1*   CBG: Recent Labs  Lab 11/17/18 0823 11/17/18 1221 11/17/18 1744 11/17/18 2144 11/18/18 0835  GLUCAP 114* 184* 182* 121* 127*   Lipid Profile: No results for input(s): CHOL, HDL, LDLCALC, TRIG, CHOLHDL, LDLDIRECT in the last 72 hours. Thyroid Function Tests: No results for input(s): TSH, T4TOTAL, FREET4, T3FREE, THYROIDAB in the last 72 hours. Anemia Panel: No results for input(s): VITAMINB12, FOLATE, FERRITIN, TIBC, IRON, RETICCTPCT in the last 72 hours. Sepsis Labs: No results for input(s): PROCALCITON, LATICACIDVEN in the last 168 hours.  Recent Results (from the past 240 hour(s))  MRSA PCR Screening     Status: None   Collection Time: 11/16/18  8:39 PM  Result Value Ref Range Status   MRSA by PCR NEGATIVE NEGATIVE Final    Comment:        The  GeneXpert MRSA Assay (FDA approved for NASAL specimens only), is one component of a comprehensive MRSA colonization surveillance program. It is not intended to diagnose MRSA infection nor to guide or monitor treatment for MRSA infections. Performed at [facility], [address] [place], [place] [address]          Radiology Studies: No results found.      Scheduled Meds: . acetaminophen  650 mg Oral QID  . amitriptyline  12.5 mg Oral QHS  . donepezil  10 mg Oral QHS  . enoxaparin (LOVENOX) injection  30 mg Subcutaneous Q24H  . famotidine  20 mg Oral Daily  . feeding supplement (ENSURE ENLIVE)  237 mL Oral QPC supper  . insulin aspart  0-9 Units Subcutaneous TID WC  . ketorolac  15 mg Intravenous Once  . memantine  10 mg Oral BID  . sertraline  25 mg Oral Daily   Continuous Infusions:   LOS: 2 days     Alwyn RenElizabeth G Mathews, MD [facility]  If 7PM-7AM, please contact night-coverage [email or web address]  Password [email or web address] 11/18/2018, 12:09 PM

## 2018-11-18 NOTE — Plan of Care (Signed)

## 2018-11-19 DIAGNOSIS — Z794 Long term (current) use of insulin: Secondary | ICD-10-CM

## 2018-11-19 DIAGNOSIS — E119 Type 2 diabetes mellitus without complications: Secondary | ICD-10-CM

## 2018-11-19 DIAGNOSIS — G309 Alzheimer's disease, unspecified: Secondary | ICD-10-CM | POA: Diagnosis not present

## 2018-11-19 DIAGNOSIS — S22031A Stable burst fracture of third thoracic vertebra, initial encounter for closed fracture: Secondary | ICD-10-CM | POA: Diagnosis not present

## 2018-11-19 DIAGNOSIS — S22001A Stable burst fracture of unspecified thoracic vertebra, initial encounter for closed fracture: Secondary | ICD-10-CM | POA: Diagnosis not present

## 2018-11-19 DIAGNOSIS — F0281 Dementia in other diseases classified elsewhere with behavioral disturbance: Secondary | ICD-10-CM

## 2018-11-19 LAB — GLUCOSE, CAPILLARY
Glucose-Capillary: 105 mg/dL — ABNORMAL HIGH (ref 70–99)
Glucose-Capillary: 151 mg/dL — ABNORMAL HIGH (ref 70–99)

## 2018-11-19 MED ORDER — ACETAMINOPHEN 325 MG PO TABS: 650.0000 mg | ORAL_TABLET | ORAL | Status: AC | PRN

## 2018-11-19 NOTE — Progress Notes (Signed)
[facility]ed to [facility]. No one answering after being transferred to nurses station.

## 2018-11-19 NOTE — Progress Notes (Signed)
D/C Summary sent to SNF. Patient niece to transport.  Nurse given the number to call report. \  Vivi BarrackNicole Brittnye Josephs, Alexander MtLCSW, MSW Clinical Social Worker  [phone] 11/19/2018  12:05 PM

## 2018-11-19 NOTE — [place] FL2 (Addendum)
[place] MEDICAID FL2 LEVEL OF CARE SCREENING TOOL     IDENTIFICATION  Patient Name: Katherine Wilson Birthdate: 04/19/1923 Sex: female Admission Date (Current Location): 11/16/2018  [facility] [place]:      Facility and Address:  [facility],  [address], [place] [id number]      Provider Number: [id number]  Attending Physician Name and Address:  Tyrone NineGrunz, Ryan B, MD  Relative Name and Phone Number:       Current Level of Care: Hospital Recommended Level of Care: Skilled Nursing Facility Prior Approval Number:    Date Approved/Denied:   PASRR Number:    Discharge Plan:      Current Diagnoses: Patient Active Problem List   Diagnosis Date Noted  . Burst fracture of thoracic vertebra, closed, initial encounter (HCC) 11/16/2018  . Dementia (HCC) 11/16/2018  . Type 2 diabetes mellitus (HCC) 11/16/2018  . Hypocalcemia 11/16/2018  . Major depression 11/16/2018    Orientation RESPIRATION BLADDER Height & Weight     Self  Normal Continent Weight: 101 lb 4.8 oz (45.9 kg) Height:  5\' 2"  (157.5 cm)  BEHAVIORAL SYMPTOMS/MOOD NEUROLOGICAL BOWEL NUTRITION STATUS      Continent Diet(Heart Healthy Carb Modified. )  AMBULATORY STATUS COMMUNICATION OF NEEDS Skin   Extensive Assist Verbally Normal                       Personal Care Assistance Level of Assistance  Bathing, Feeding, Dressing Bathing Assistance: Limited assistance Feeding assistance: Independent Dressing Assistance: Maximum assistance     Functional Limitations Info  Sight, Hearing, Speech Sight Info: Adequate Hearing Info: Adequate Speech Info: Adequate    SPECIAL CARE FACTORS FREQUENCY                       Contractures Contractures Info: Not present    Additional Factors Info  Code Status, Allergies, Psychotropic, Insulin Sliding Scale Code Status Info: Fullcode  Allergies Info: Allergies: Chicken Allergy, Eggs Or Egg-derived Products, Latex Psychotropic Info:  See discharge summary  Insulin Sliding Scale Info: See medication list        Current Medications (11/19/2018):  This is the current hospital active medication list Current Facility-Administered Medications  Medication Dose Route Frequency Provider Last Rate Last Dose  . acetaminophen (TYLENOL) tablet 650 mg  650 mg Oral QID Alwyn RenMathews, Elizabeth G, MD   650 mg at 11/19/18 [id number]  . amitriptyline (ELAVIL) tablet 12.5 mg  12.5 mg Oral QHS Bobette Mortiz, David Manuel, MD   12.5 mg at 11/18/18 1926  . donepezil (ARICEPT) tablet 10 mg  10 mg Oral QHS Bobette Mortiz, David Manuel, MD   10 mg at 11/18/18 1926  . enoxaparin (LOVENOX) injection 30 mg  30 mg Subcutaneous Q24H Bobette Mortiz, David Manuel, MD   30 mg at 11/19/18 [id number]  . famotidine (PEPCID) tablet 20 mg  20 mg Oral Daily Bobette Mortiz, David Manuel, MD   20 mg at 11/19/18 [id number]  . feeding supplement (ENSURE ENLIVE) (ENSURE ENLIVE) liquid 237 mL  237 mL Oral QPC supper Bobette Mortiz, David Manuel, MD   237 mL at 11/18/18 1743  . insulin aspart (novoLOG) injection 0-9 Units  0-9 Units Subcutaneous TID WC Bobette Mortiz, David Manuel, MD   1 Units at 11/18/18 0845  . ketorolac (TORADOL) 15 MG/ML injection 15 mg  15 mg Intravenous Once Bobette Mortiz, David Manuel, MD      . memantine [facility](NAMENDA) tablet 10 mg  10 mg Oral BID Bobette Mortiz, David Manuel,  MD   10 mg at 11/19/18 0923  . ondansetron (ZOFRAN) injection 4 mg  4 mg Intravenous Q6H PRN Bobette Mortiz, David Manuel, MD      . oxyCODONE (Oxy IR/ROXICODONE) immediate release tablet 2.5 mg  2.5 mg Oral Q3H PRN Alwyn RenMathews, Elizabeth G, MD      . sertraline (ZOLOFT) tablet 25 mg  25 mg Oral Daily Bobette Mortiz, David Manuel, MD   25 mg at 11/19/18 [id number]     Discharge Medications: Please see discharge summary for a list of discharge medications.  Relevant Imaging Results:  Relevant Lab Results:   Additional Information ssn: [id number]  Clearance CootsNicole A Heith Haigler, LCSW

## 2018-11-19 NOTE — Clinical Social Work Note (Addendum)
Clinical Social Work Assessment  Patient Details  Name: Katherine Wilson MRN: [id number] Date of Birth: 04/22/1923  Date of referral:  11/19/18               Reason for consult:  Discharge Planning                Permission sought to share information with:  Family Supports Permission granted to share information::  Yes, Verbal Permission Granted  Name::       davis,rebecca  Agency::   [facility]   Relationship::   Niece   Contact Information:     Housing/Transportation Living arrangements for the past 2 months:  [facility] Facility Source of Information:  (Niece ) Patient Interpreter Needed:  None Criminal Activity/Legal Involvement Pertinent to Current Situation/Hospitalization:  No - Comment as needed Significant Relationships:  Other Family Members Lives with:  Facility Resident Do you feel safe going back to the place where you live?  Yes Need for family participation in patient care:  No (Coment)  Care giving concerns:   No concerns presented. [facility]  Patient admitted for Burst Fracture thoracic Vertebra/ per nurse the patient is ambulatory with assistance([facility]  Social Worker assessment / plan:  CSW discussed the discharge plan with the patient niece. Per niece, the patient is ambulatory, and often walks behind wheelchair for support. She reports the patient needs assistance with bathing and dressing.  CSW reached out to SNF, the facility ready to accept patient. Patient niece informed of discharge. She plans to transport the patient.   FL2 complete.   Plan:SNF   Employment status:  Retired Database administratornsurance information:  Managed Medicare PT Recommendations:  Not assessed at this time Information / Referral to community resources:  Skilled Nursing Facility  Patient/Family's Response to care:  Agreeable and Responding well to care.   Patient/Family's Understanding of and Emotional Response to Diagnosis, Current Treatment,  and Prognosis: Patient has dementia. Patient niece very involved in the patient care and has a good understanding of her follow up care/ [facility].   Emotional Assessment Appearance:  Appears stated age Attitude/Demeanor/Rapport:    Affect (typically observed):    Orientation:  Oriented to Self Alcohol / Substance use:  Not Applicable Psych involvement (Current and /or in the community):  No (Comment)  Discharge Needs  Concerns to be addressed:  Discharge Planning Concerns Readmission within the last 30 days:  No Current discharge risk:  Dependent with Mobility Barriers to Discharge:  No Barriers Identified   Clearance CootsNicole A Anis Degidio, LCSW 11/19/2018, 9:38 AM

## 2018-11-19 NOTE — Discharge Summary (Signed)
Physician Discharge Summary  Katherine Wilson ZOX:[id number] DOB: [birth year] DOA: 11/16/2018  PCP: System, Pcp Not In  Admit date: 11/16/2018 Discharge date: 11/19/2018  [facility] Disposition: [facility]   Recommendations for Outpatient Follow-up:  1. Follow up with PCP in 1-2 weeks  Home Health: None new Equipment/Devices: None new, has equipment Discharge Condition: Stable CODE STATUS: Full Diet recommendation: Carb-modified  Brief/Interim Summary: Katherine Wilson is a 84 y.o.femalewith medical history significant ofAlzheimer's disease, type 2 diabetes, major depression, and history of volvulus who presented after an unwitnessed fall at nursing facility. History limited by dementia. She was in no distress with stable vital signs. WBC 8.8k, hemoglobin 12.5 g/dL and platelets 811B. CMP with glucose 149, calcium 8.4 mg/dL, all other values are within normal limits. CT head did not show any acute abnormalities. There is aprior lacunar infarct in the genu of the left internal capsule. Cervical spine showed DDD changes, but no acute fractures.However, CT thoracic spine showed an acute burst fracture of T3 with 3 mm osseous retropulsion and no other acute findings, small T6-7 disc protrusion noted. Neurosurgery was contacted, recommended against any surgery. She was admitted for pain control which has improved and she is stable for discharge to nursing facility.  Discharge Diagnoses:  Principal Problem:   Burst fracture of thoracic vertebra, closed, initial encounter (HCC) Active Problems:   Dementia (HCC)   Type 2 diabetes mellitus (HCC)   Hypocalcemia   Major depression  Acute burst fracture of T3 with 3mm osseous retropulsion, traumatic: No surgery indicated per neurosurgery. Bedrest was recommended, now stable to move.  - Continue po pain control (has not taken any in past 24 hours)  Alzheimer's dementia: Stable.  - Continue aricept, namenda, delirium  precautions  T2DM:  - Continue sliding scale insulin  Depression:  - Continue zoloft, elavil  Discharge Instructions Discharge Instructions    Diet Carb Modified   Complete by:  As directed    Increase activity slowly   Complete by:  As directed      Allergies as of 11/19/2018      Reactions   Chicken Allergy Other (See Comments)   Per MAR - unknown reaction   Eggs Or Egg-derived Products Other (See Comments)   Per MAR - unknown reaction   Latex Other (See Comments)   Per MAR - unknown reaction      Medication List    TAKE these medications   acetaminophen 325 MG tablet Commonly known as:  TYLENOL Take 2 tablets (650 mg total) by mouth every 4 (four) hours as needed (pain).   donepezil 10 MG tablet Commonly known as:  ARICEPT Take 10 mg by mouth at bedtime.   ELAVIL PO Take 12.5 mg by mouth at bedtime.   ENSURE Take 237 mLs by mouth daily after supper.   insulin lispro 100 UNIT/ML injection Commonly known as:  HUMALOG Inject 0-14 Units into the skin 2 (two) times daily. Per sliding scale, 200 = 0 units 201-250 = 2 units 251-300 = 4 units 301-350 = 6 units 351-400 = 8 units 401-450 = 10 units 451-500 = 12 units If high = 14 units and recheck in 2 hours If above 400 or below 100 = call MD   memantine 10 MG tablet Commonly known as:  NAMENDA Take 10 mg by mouth 2 (two) times daily.   sertraline 25 MG tablet Commonly known as:  ZOLOFT Take 25 mg by mouth daily.       Allergies  Allergen Reactions  .  Chicken Allergy Other (See Comments)    Per MAR - unknown reaction  . Eggs Or Egg-Derived Products Other (See Comments)    Per MAR - unknown reaction  . Latex Other (See Comments)    Per MAR - unknown reaction    Consultations:  Neurosurgery by phone at admission  Procedures/Studies: Dg Chest 2 View  Result Date: 11/16/2018 CLINICAL DATA:  Chest pain EXAM: CHEST - 2 VIEW COMPARISON:  None. FINDINGS: The patient's mandible obscures portions  of the apices. There is atelectatic change in the left lower lobe. Visualized lungs elsewhere clear. Heart size and pulmonary vascularity are normal. No adenopathy evident. Aorta is prominent. No bone lesions evident. IMPRESSION: Note that patient's mandible obscures portions of the apices. There is atelectatic change in the left lower lobe. Visualized lungs elsewhere clear. Heart size normal. Prominence of the aorta may reflect chronic hypertensive change. Electronically Signed   By: Bretta BangWilliam  Woodruff III M.D.   On: 11/16/2018 08:00   Ct Head Wo Contrast  Result Date: 11/16/2018 CLINICAL DATA:  Unwitnessed fall.  Alzheimer's dementia EXAM: CT HEAD WITHOUT CONTRAST CT CERVICAL SPINE WITHOUT CONTRAST TECHNIQUE: Multidetector CT imaging of the head and cervical spine was performed following the standard protocol without intravenous contrast. Multiplanar CT image reconstructions of the cervical spine were also generated. COMPARISON:  None. FINDINGS: CT HEAD FINDINGS Brain: There is moderate diffuse atrophy. There is no intracranial mass, hemorrhage, extra-axial fluid collection, or midline shift. There is patchy small vessel disease in the centra semiovale bilaterally. There is a small lacunar infarct in the genu of the left internal capsule. No acute appearing infarct is demonstrable on this study. Vascular: There is no hyperdense vessel. There is calcification in each carotid siphon region. Skull: The bony calvarium appears intact. There is frontal hyperostosis. Sinuses/Orbits: There is mucosal thickening in several ethmoid air cells as well as in portions of the right maxillary antrum. There is also opacification in the inferomedial right frontal sinus. Orbits appear symmetric bilaterally except for evidence of cataract removal on the left. Other: Mastoid air cells are clear. CT CERVICAL SPINE FINDINGS Note that there is a degree of motion artifact. Alignment: There is 3 mm of retrolisthesis of C6 on C7. No  other spondylolisthesis is evident. Skull base and vertebrae: Skull base and craniocervical junction regions appear normal. There is pannus posterior to the odontoid, not causing appreciable impression on the craniocervical junction. There is a wedge fracture of the T3 vertebral body with mild retropulsion of bone into the anterior thecal sac region. No fracture is appreciable in the cervical region. No blastic or lytic bone lesions are evident. Soft tissues and spinal canal: The prevertebral soft tissues and predental space regions are normal. There is no cord or canal hematoma evident. No paraspinous lesions. Disc levels: There is severe disc space narrowing at C5-6 and C6-7. There is milder disc space narrowing at C4-5. There are prominent anterior osteophytes at C5 and C6. There is facet hypertrophy at multiple levels bilaterally. There is exit foraminal narrowing at multiple levels, including at C3-4 on the right, at C4-5 on the left, at C5-6 bilaterally, and C6-7 bilaterally. No frank disc extrusion or stenosis is evident. Upper chest: There is aortic atherosclerosis. Visualized upper lung regions are clear. Other: There are foci of calcification in each carotid artery. IMPRESSION: CT head: Atrophy with periventricular small vessel disease. Prior small lacunar infarct in the genu of the left internal capsule. No acute infarct. No mass or hemorrhage. There are foci  of arterial vascular calcification. There are foci of paranasal sinus disease. CT cervical spine: Less than optimal study due to motion artifact. Wedge fracture of the T3 vertebral body, acute appearing, with evidence of mild retropulsion of bone posteriorly. This fracture appears acute. No acute appearing cervical fracture seen with limitations due to motion. Mild retrolisthesis of C6 on C7 is felt to be due to underlying spondylosis. Note that there is multilevel arthropathy with exit foraminal narrowing due to bony hypertrophy at multiple levels.  There is aortic and carotid artery calcification. Aortic Atherosclerosis (ICD10-I70.0). Critical Value/emergent results were called by telephone at the time of interpretation on 11/16/2018 at 8:14 am to Dr. Alona Bene , who verbally acknowledged these results. Electronically Signed   By: Bretta Bang III M.D.   On: 11/16/2018 08:14   Ct Cervical Spine Wo Contrast  Result Date: 11/16/2018 CLINICAL DATA:  Unwitnessed fall.  Alzheimer's dementia EXAM: CT HEAD WITHOUT CONTRAST CT CERVICAL SPINE WITHOUT CONTRAST TECHNIQUE: Multidetector CT imaging of the head and cervical spine was performed following the standard protocol without intravenous contrast. Multiplanar CT image reconstructions of the cervical spine were also generated. COMPARISON:  None. FINDINGS: CT HEAD FINDINGS Brain: There is moderate diffuse atrophy. There is no intracranial mass, hemorrhage, extra-axial fluid collection, or midline shift. There is patchy small vessel disease in the centra semiovale bilaterally. There is a small lacunar infarct in the genu of the left internal capsule. No acute appearing infarct is demonstrable on this study. Vascular: There is no hyperdense vessel. There is calcification in each carotid siphon region. Skull: The bony calvarium appears intact. There is frontal hyperostosis. Sinuses/Orbits: There is mucosal thickening in several ethmoid air cells as well as in portions of the right maxillary antrum. There is also opacification in the inferomedial right frontal sinus. Orbits appear symmetric bilaterally except for evidence of cataract removal on the left. Other: Mastoid air cells are clear. CT CERVICAL SPINE FINDINGS Note that there is a degree of motion artifact. Alignment: There is 3 mm of retrolisthesis of C6 on C7. No other spondylolisthesis is evident. Skull base and vertebrae: Skull base and craniocervical junction regions appear normal. There is pannus posterior to the odontoid, not causing appreciable  impression on the craniocervical junction. There is a wedge fracture of the T3 vertebral body with mild retropulsion of bone into the anterior thecal sac region. No fracture is appreciable in the cervical region. No blastic or lytic bone lesions are evident. Soft tissues and spinal canal: The prevertebral soft tissues and predental space regions are normal. There is no cord or canal hematoma evident. No paraspinous lesions. Disc levels: There is severe disc space narrowing at C5-6 and C6-7. There is milder disc space narrowing at C4-5. There are prominent anterior osteophytes at C5 and C6. There is facet hypertrophy at multiple levels bilaterally. There is exit foraminal narrowing at multiple levels, including at C3-4 on the right, at C4-5 on the left, at C5-6 bilaterally, and C6-7 bilaterally. No frank disc extrusion or stenosis is evident. Upper chest: There is aortic atherosclerosis. Visualized upper lung regions are clear. Other: There are foci of calcification in each carotid artery. IMPRESSION: CT head: Atrophy with periventricular small vessel disease. Prior small lacunar infarct in the genu of the left internal capsule. No acute infarct. No mass or hemorrhage. There are foci of arterial vascular calcification. There are foci of paranasal sinus disease. CT cervical spine: Less than optimal study due to motion artifact. Wedge fracture of the T3 vertebral  body, acute appearing, with evidence of mild retropulsion of bone posteriorly. This fracture appears acute. No acute appearing cervical fracture seen with limitations due to motion. Mild retrolisthesis of C6 on C7 is felt to be due to underlying spondylosis. Note that there is multilevel arthropathy with exit foraminal narrowing due to bony hypertrophy at multiple levels. There is aortic and carotid artery calcification. Aortic Atherosclerosis (ICD10-I70.0). Critical Value/emergent results were called by telephone at the time of interpretation on 11/16/2018 at  8:14 am to Dr. Alona Bene , who verbally acknowledged these results. Electronically Signed   By: Bretta Bang III M.D.   On: 11/16/2018 08:14   Ct Thoracic Spine Wo Contrast  Result Date: 11/16/2018 CLINICAL DATA:  Found down at nursing home. History of diabetes, Alzheimer's/dementia. T3 fracture on cervical spine CT. EXAM: CT THORACIC SPINE WITHOUT CONTRAST TECHNIQUE: Multidetector CT images of the thoracic were obtained using the standard protocol without intravenous contrast. COMPARISON:  CT cervical spine same date. FINDINGS: Alignment: Normal. Vertebrae: As demonstrated on earlier cervical spine CT, there is an acute-appearing burst fracture involving the T3 vertebral body. This is associated with approximately 40% loss the vertebral body height centrally and 3 mm of osseous retropulsion. The posterior elements appear intact, and there is no widening of the interpedicular distance. No other acute osseous findings are demonstrated. Paraspinal and other soft tissues: Probable mild paraspinal hemorrhage at the level of the T3 fracture. No evidence of epidural hematoma. There is atherosclerosis of the aorta, great vessels and coronary arteries. There are dependent opacities in both lungs which likely represent atelectasis. Probable left renal cyst. Disc levels: Mild degenerative changes throughout the visualized spine with posterior osteophytes. There is a small central disc protrusion at T6-7. No large disc herniation or high-grade spinal stenosis identified. IMPRESSION: 1. Acute burst fracture at T3 with 3 mm of osseous retropulsion. 2. No other acute findings within the thoracic spine. 3. Mild thoracic spondylosis.  Small disc protrusion at T6-7. Electronically Signed   By: Carey Bullocks M.D.   On: 11/16/2018 08:56   Subjective: No new complaints, has stable mild pain in the upper back. No overnight events reported by RN.  Discharge Exam: Vitals:   11/18/18 2013 11/19/18 0651  BP: (!) 139/55  120/74  Pulse: 71 76  Resp: 20 20  Temp: 98.3 F (36.8 C) 97.8 F (36.6 C)  SpO2: 94% 98%   General: Pleasant, elderly female Cardiovascular: RRR, S1/S2 +, no rubs, no gallops Respiratory: CTA bilaterally, no wheezing, no rhonchi Abdominal: Soft, NT, ND, bowel sounds + Extremities: No edema, no cyanosis MSK: No significant point tenderness in midline spine. Neuro: Alert, confused, no focal weakness or reported sensory deficits in extremities.  Labs: Basic Metabolic Panel: Recent Labs  Lab 11/16/18 1041  NA 139  K 3.6  CL 105  CO2 24  GLUCOSE 149*  BUN 12  CREATININE 0.50  CALCIUM 8.4*  MG 2.0  PHOS 3.1   Liver Function Tests: Recent Labs  Lab 11/16/18 1041  AST 25  ALT 21  ALKPHOS 49  BILITOT 1.1  PROT 6.5  ALBUMIN 3.8   CBC: Recent Labs  Lab 11/16/18 1041  WBC 8.8  NEUTROABS 7.5  HGB 12.5  HCT 39.3  MCV 94.7  PLT 149*   Microbiology Recent Results (from the past 240 hour(s))  MRSA PCR Screening     Status: None   Collection Time: 11/16/18  8:39 PM  Result Value Ref Range Status   MRSA by PCR NEGATIVE  NEGATIVE Final    Comment:        The GeneXpert MRSA Assay (FDA approved for NASAL specimens only), is one component of a comprehensive MRSA colonization surveillance program. It is not intended to diagnose MRSA infection nor to guide or monitor treatment for MRSA infections. Performed at [facility], [address] [place], [place] [address]     Time coordinating discharge: Approximately 40 minutes  Tyrone Nineyan B Velmer Broadfoot, MD  [facility] 11/19/2018, 11:51 AM Pager [phone]

## 2020-02-07 ENCOUNTER — Inpatient Hospital Stay (HOSPITAL_COMMUNITY)
Admission: EM | Admit: 2020-02-07 | Discharge: 2020-02-08 | DRG: 951 | Disposition: A | Discharge status: Hospice | Payer: Medicare PPO | Attending: Family Medicine | Admitting: Family Medicine

## 2020-02-07 ENCOUNTER — Encounter (HOSPITAL_COMMUNITY): Payer: Self-pay

## 2020-02-07 ENCOUNTER — Other Ambulatory Visit: Payer: Self-pay

## 2020-02-07 ENCOUNTER — Emergency Department (HOSPITAL_COMMUNITY): Payer: Medicare PPO

## 2020-02-07 DIAGNOSIS — Z79899 Other long term (current) drug therapy: Secondary | ICD-10-CM

## 2020-02-07 DIAGNOSIS — Z20822 Contact with and (suspected) exposure to covid-19: Secondary | ICD-10-CM | POA: Diagnosis present

## 2020-02-07 DIAGNOSIS — F0280 Dementia in other diseases classified elsewhere without behavioral disturbance: Secondary | ICD-10-CM | POA: Diagnosis present

## 2020-02-07 DIAGNOSIS — Z91018 Allergy to other foods: Secondary | ICD-10-CM | POA: Diagnosis not present

## 2020-02-07 DIAGNOSIS — Z515 Encounter for palliative care: Secondary | ICD-10-CM | POA: Diagnosis present

## 2020-02-07 DIAGNOSIS — F0390 Unspecified dementia without behavioral disturbance: Secondary | ICD-10-CM | POA: Diagnosis present

## 2020-02-07 DIAGNOSIS — G309 Alzheimer's disease, unspecified: Secondary | ICD-10-CM | POA: Diagnosis present

## 2020-02-07 DIAGNOSIS — Z66 Do not resuscitate: Secondary | ICD-10-CM | POA: Diagnosis present

## 2020-02-07 DIAGNOSIS — E43 Unspecified severe protein-calorie malnutrition: Secondary | ICD-10-CM | POA: Diagnosis present

## 2020-02-07 DIAGNOSIS — K562 Volvulus: Secondary | ICD-10-CM | POA: Diagnosis present

## 2020-02-07 DIAGNOSIS — Z91012 Allergy to eggs: Secondary | ICD-10-CM | POA: Diagnosis not present

## 2020-02-07 DIAGNOSIS — Z794 Long term (current) use of insulin: Secondary | ICD-10-CM

## 2020-02-07 DIAGNOSIS — F329 Major depressive disorder, single episode, unspecified: Secondary | ICD-10-CM | POA: Diagnosis present

## 2020-02-07 DIAGNOSIS — E119 Type 2 diabetes mellitus without complications: Secondary | ICD-10-CM | POA: Diagnosis present

## 2020-02-07 DIAGNOSIS — F0281 Dementia in other diseases classified elsewhere with behavioral disturbance: Secondary | ICD-10-CM | POA: Diagnosis not present

## 2020-02-07 DIAGNOSIS — Z91040 Latex allergy status: Secondary | ICD-10-CM

## 2020-02-07 LAB — COMPREHENSIVE METABOLIC PANEL
ALT: 17 U/L (ref 0–44)
AST: 19 U/L (ref 15–41)
Albumin: 4 g/dL (ref 3.5–5.0)
Alkaline Phosphatase: 48 U/L (ref 38–126)
Anion gap: 12 (ref 5–15)
BUN: 25 mg/dL — ABNORMAL HIGH (ref 8–23)
CO2: 26 mmol/L (ref 22–32)
Calcium: 8.9 mg/dL (ref 8.9–10.3)
Chloride: 101 mmol/L (ref 98–111)
Creatinine, Ser: 0.61 mg/dL (ref 0.44–1.00)
GFR calc Af Amer: >60 mL/min (ref >60)
GFR calc non Af Amer: >60 mL/min (ref >60)
Glucose, Bld: 232 mg/dL — ABNORMAL HIGH (ref 70–99)
Potassium: 3.1 mmol/L — ABNORMAL LOW (ref 3.5–5.1)
Sodium: 139 mmol/L (ref 135–145)
Total Bilirubin: 1.3 mg/dL — ABNORMAL HIGH (ref 0.3–1.2)
Total Protein: 6.9 g/dL (ref 6.5–8.1)

## 2020-02-07 LAB — RESPIRATORY PANEL BY RT PCR (FLU A&B, COVID)
Influenza A by PCR: NEGATIVE
Influenza B by PCR: NEGATIVE
SARS Coronavirus 2 by RT PCR: NEGATIVE

## 2020-02-07 LAB — CBC WITH DIFFERENTIAL/PLATELET
Abs Immature Granulocytes: 0.05 10*3/uL (ref 0.00–0.07)
Basophils Absolute: 0 10*3/uL (ref 0.0–0.1)
Basophils Relative: 0 %
Eosinophils Absolute: 0 10*3/uL (ref 0.0–0.5)
Eosinophils Relative: 0 %
HCT: 42.1 % (ref 36.0–46.0)
Hemoglobin: 13.7 g/dL (ref 12.0–15.0)
Immature Granulocytes: 0 %
Lymphocytes Relative: 6 %
Lymphs Abs: 0.7 10*3/uL (ref 0.7–4.0)
MCH: 30.2 pg (ref 26.0–34.0)
MCHC: 32.5 g/dL (ref 30.0–36.0)
MCV: 92.7 fL (ref 80.0–100.0)
Monocytes Absolute: 1.1 10*3/uL — ABNORMAL HIGH (ref 0.1–1.0)
Monocytes Relative: 8 %
Neutro Abs: 11.5 10*3/uL — ABNORMAL HIGH (ref 1.7–7.7)
Neutrophils Relative %: 86 %
Platelets: 245 10*3/uL (ref 150–400)
RBC: 4.54 MIL/uL (ref 3.87–5.11)
RDW: 13.3 % (ref 11.5–15.5)
WBC: 13.3 10*3/uL — ABNORMAL HIGH (ref 4.0–10.5)
nRBC: 0 % (ref 0.0–0.2)

## 2020-02-07 LAB — URINALYSIS, ROUTINE W REFLEX MICROSCOPIC
Bilirubin Urine: NEGATIVE
Glucose, UA: 50 mg/dL — AB
Ketones, ur: 80 mg/dL — AB
Nitrite: NEGATIVE
Protein, ur: 100 mg/dL — AB
RBC / HPF: >50 RBC/hpf — ABNORMAL HIGH (ref 0–5)
Specific Gravity, Urine: 1.029 (ref 1.005–1.030)
pH: 7 (ref 5.0–8.0)

## 2020-02-07 LAB — GLUCOSE, CAPILLARY: Glucose-Capillary: 202 mg/dL — ABNORMAL HIGH (ref 70–99)

## 2020-02-07 MED ORDER — SODIUM CHLORIDE (PF) 0.9 % IJ SOLN
INTRAMUSCULAR | Status: AC
  Filled 2020-02-07: qty 50

## 2020-02-07 MED ORDER — HALOPERIDOL LACTATE 5 MG/ML IJ SOLN: 0.5000 mg | INTRAMUSCULAR | Status: DC | PRN

## 2020-02-07 MED ORDER — OXYCODONE HCL 5 MG PO TABS: 5.0000 mg | ORAL_TABLET | ORAL | Status: DC | PRN

## 2020-02-07 MED ORDER — MAGIC MOUTHWASH
15.0000 mL | Freq: Four times a day (QID) | ORAL | Status: DC | PRN
  Filled 2020-02-07: qty 15

## 2020-02-07 MED ORDER — TRAZODONE HCL 50 MG PO TABS: 25.0000 mg | ORAL_TABLET | Freq: Every evening | ORAL | Status: DC | PRN

## 2020-02-07 MED ORDER — DONEPEZIL HCL 10 MG PO TABS
10.0000 mg | ORAL_TABLET | Freq: Every day | ORAL | Status: DC
  Administered 2020-02-07: 10 mg via ORAL
  Filled 2020-02-07: qty 1

## 2020-02-07 MED ORDER — LORAZEPAM 2 MG/ML IJ SOLN: 1.0000 mg | INTRAMUSCULAR | Status: DC | PRN

## 2020-02-07 MED ORDER — ACETAMINOPHEN 325 MG PO TABS: 650.0000 mg | ORAL_TABLET | Freq: Four times a day (QID) | ORAL | Status: DC | PRN

## 2020-02-07 MED ORDER — ONDANSETRON HCL 4 MG/2ML IJ SOLN: 4.0000 mg | Freq: Four times a day (QID) | INTRAMUSCULAR | Status: DC | PRN

## 2020-02-07 MED ORDER — INSULIN ASPART 100 UNIT/ML ~~LOC~~ SOLN
0.0000 [IU] | Freq: Three times a day (TID) | SUBCUTANEOUS | Status: DC
  Administered 2020-02-08 (×2): 2 [IU] via SUBCUTANEOUS
  Filled 2020-02-07: qty 0.09

## 2020-02-07 MED ORDER — ACETAMINOPHEN 650 MG RE SUPP: 650.0000 mg | Freq: Four times a day (QID) | RECTAL | Status: DC | PRN

## 2020-02-07 MED ORDER — HALOPERIDOL 0.5 MG PO TABS: 0.5000 mg | ORAL_TABLET | ORAL | Status: DC | PRN

## 2020-02-07 MED ORDER — LORAZEPAM 1 MG PO TABS: 1.0000 mg | ORAL_TABLET | ORAL | Status: DC | PRN

## 2020-02-07 MED ORDER — SERTRALINE HCL 50 MG PO TABS
25.0000 mg | ORAL_TABLET | Freq: Every day | ORAL | Status: DC
  Administered 2020-02-08: 25 mg via ORAL
  Filled 2020-02-07: qty 1

## 2020-02-07 MED ORDER — PIPERACILLIN-TAZOBACTAM 3.375 G IVPB 30 MIN
3.3750 g | Freq: Once | INTRAVENOUS | Status: AC
  Administered 2020-02-07: 3.375 g via INTRAVENOUS
  Filled 2020-02-07: qty 50

## 2020-02-07 MED ORDER — MORPHINE SULFATE (PF) 2 MG/ML IV SOLN
1.0000 mg | INTRAVENOUS | Status: DC | PRN
  Administered 2020-02-08 (×3): 1 mg via INTRAVENOUS
  Filled 2020-02-07 (×3): qty 1

## 2020-02-07 MED ORDER — DIPHENHYDRAMINE HCL 50 MG/ML IJ SOLN: 12.5000 mg | INTRAMUSCULAR | Status: DC | PRN

## 2020-02-07 MED ORDER — POLYVINYL ALCOHOL 1.4 % OP SOLN: 1.0000 [drp] | Freq: Four times a day (QID) | OPHTHALMIC | Status: DC | PRN

## 2020-02-07 MED ORDER — MEMANTINE HCL 10 MG PO TABS
10.0000 mg | ORAL_TABLET | Freq: Two times a day (BID) | ORAL | Status: DC
  Administered 2020-02-07 – 2020-02-08 (×2): 10 mg via ORAL
  Filled 2020-02-07 (×2): qty 1

## 2020-02-07 MED ORDER — GLYCOPYRROLATE 0.2 MG/ML IJ SOLN: 0.2000 mg | INTRAMUSCULAR | Status: DC | PRN

## 2020-02-07 MED ORDER — SODIUM CHLORIDE 0.9 % IV SOLN
12.5000 mg | Freq: Four times a day (QID) | INTRAVENOUS | Status: DC | PRN
  Filled 2020-02-07: qty 0.5

## 2020-02-07 MED ORDER — BIOTENE DRY MOUTH MT LIQD: 15.0000 mL | OROMUCOSAL | Status: DC | PRN

## 2020-02-07 MED ORDER — IOHEXOL 300 MG/ML  SOLN
75.0000 mL | Freq: Once | INTRAMUSCULAR | Status: AC | PRN
  Administered 2020-02-07: 75 mL via INTRAVENOUS

## 2020-02-07 MED ORDER — LORAZEPAM 2 MG/ML PO CONC: 1.0000 mg | ORAL | Status: DC | PRN

## 2020-02-07 MED ORDER — GLYCOPYRROLATE 1 MG PO TABS
1.0000 mg | ORAL_TABLET | ORAL | Status: DC | PRN
  Filled 2020-02-07: qty 1

## 2020-02-07 MED ORDER — HALOPERIDOL LACTATE 2 MG/ML PO CONC
0.5000 mg | ORAL | Status: DC | PRN
  Filled 2020-02-07: qty 0.3

## 2020-02-07 MED ORDER — ONDANSETRON 4 MG PO TBDP: 4.0000 mg | ORAL_TABLET | Freq: Four times a day (QID) | ORAL | Status: DC | PRN

## 2020-02-07 MED ORDER — MORPHINE SULFATE (PF) 2 MG/ML IV SOLN
2.0000 mg | Freq: Once | INTRAVENOUS | Status: AC
  Administered 2020-02-07: 16:00:00 2 mg via INTRAVENOUS
  Filled 2020-02-07: qty 1

## 2020-02-07 NOTE — ED Provider Notes (Signed)
[facility]-EMERGENCY DEPT Provider Note   CSN: [id number] Arrival date & time: 02/07/20  1234     History Chief Complaint  Patient presents with  . Abdominal Pain    Katherine Wilson is a 86 y.o. female who is DNR/DNI, hx of volvulus, hx of dementia, presenting from nursing facility with abdominal pain.  Here from [place] by EMS with 2 days of abdominal pain.  Patient is poor historian and cannot provide any history.  She is not on blood thinners.  Her NIECE Dara Lords is her PoA [phone]).  She tells me the patient was made DNR/DNI about 1 week ago with signed paperwork.    HPI     Past Medical History:  Diagnosis Date  . Alzheimer disease (HCC)   . Dementia (HCC)   . Diabetes mellitus without complication (HCC)   . Major depression   . Volvulus [facility])     Patient Active Problem List   Diagnosis Date Noted  . Burst fracture of thoracic vertebra, closed, initial encounter (HCC) 11/16/2018  . Dementia (HCC) 11/16/2018  . Type 2 diabetes mellitus (HCC) 11/16/2018  . Hypocalcemia 11/16/2018  . Major depression 11/16/2018    History reviewed. No pertinent surgical history.   OB History   No obstetric history on file.     History reviewed. No pertinent family history.  Social History   Tobacco Use  . Smoking status: Never Smoker  . Smokeless tobacco: Never Used  Substance Use Topics  . Alcohol use: Not Currently  . Drug use: Not Currently    Home Medications Prior to Admission medications   Medication Sig Start Date End Date Taking? Authorizing Provider  acetaminophen (TYLENOL) 325 MG tablet Take 2 tablets (650 mg total) by mouth every 4 (four) hours as needed (pain). 11/19/18   Tyrone Nine, MD  Amitriptyline HCl (ELAVIL PO) Take 12.5 mg by mouth at bedtime.    [provider]  donepezil (ARICEPT) 10 MG tablet Take 10 mg by mouth at bedtime.    [provider]  ENSURE (ENSURE) Take 237 mLs by mouth daily  after supper.    [provider]  insulin lispro (HUMALOG) 100 UNIT/ML injection Inject 0-14 Units into the skin 2 (two) times daily. Per sliding scale, 200 = 0 units 201-250 = 2 units 251-300 = 4 units 301-350 = 6 units 351-400 = 8 units 401-450 = 10 units 451-500 = 12 units If high = 14 units and recheck in 2 hours If above 400 or below 100 = call MD    [provider]  memantine (NAMENDA) 10 MG tablet Take 10 mg by mouth 2 (two) times daily.    [provider]  sertraline (ZOLOFT) 25 MG tablet Take 25 mg by mouth daily.    [provider]    Allergies    Chicken allergy, Eggs or egg-derived products, and Latex  Review of Systems   Review of Systems  Unable to perform ROS: Dementia (level 5 caveat)    Physical Exam Updated Vital Signs BP 132/69   Pulse 89   Temp 98.1 F (36.7 C)   Resp 18   SpO2 95%   Physical Exam Vitals and nursing note reviewed.  HENT:     Head: Normocephalic and atraumatic.  Eyes:     Conjunctiva/sclera: Conjunctivae normal.  Cardiovascular:     Rate and Rhythm: Normal rate and regular rhythm.  Pulmonary:     Effort: Pulmonary effort is normal. No  respiratory distress.     Breath sounds: Normal breath sounds.  Abdominal:     General: There is distension.     Palpations: Abdomen is soft.     Tenderness: There is generalized abdominal tenderness. There is no guarding or rebound.     Comments: Mild ttp  Musculoskeletal:     Cervical back: Neck supple.  Skin:    General: Skin is warm and dry.  Neurological:     Mental Status: She is alert.     ED Results / Procedures / Treatments   Labs (all labs ordered are listed, but only abnormal results are displayed) Labs Reviewed  COMPREHENSIVE METABOLIC PANEL - Abnormal; Notable for the following components:      Result Value   Potassium 3.1 (*)    Glucose, Bld 232 (*)    BUN 25 (*)    Total Bilirubin 1.3 (*)    All other components within normal limits    CBC WITH DIFFERENTIAL/PLATELET - Abnormal; Notable for the following components:   WBC 13.3 (*)    Neutro Abs 11.5 (*)    Monocytes Absolute 1.1 (*)    All other components within normal limits  URINALYSIS, ROUTINE W REFLEX MICROSCOPIC - Abnormal; Notable for the following components:   Glucose, UA 50 (*)    Hgb urine dipstick MODERATE (*)    Ketones, ur 80 (*)    Protein, ur 100 (*)    Leukocytes,Ua TRACE (*)    RBC / HPF >50 (*)    Bacteria, UA RARE (*)    All other components within normal limits  RESPIRATORY PANEL BY RT PCR (FLU A&B, COVID)    EKG None  Radiology CT ABDOMEN PELVIS W CONTRAST  Result Date: 02/07/2020 CLINICAL DATA:  Abdominal distension EXAM: CT ABDOMEN AND PELVIS WITH CONTRAST TECHNIQUE: Multidetector CT imaging of the abdomen and pelvis was performed using the standard protocol following bolus administration of intravenous contrast. CONTRAST:  22mL OMNIPAQUE IOHEXOL 300 MG/ML  SOLN COMPARISON:  None. FINDINGS: Lower chest: Bibasilar atelectasis, right greater than left. Normal heart size. Hepatobiliary: Mass effect on the anterior aspect of the right hepatic lobe from dilated large bowel and mesenteric fat. Multiple scattered low-density lesions within the liver including one within the anterior aspect of the left hepatic lobe containing a coarse calcification. Prior cholecystectomy. Pancreas: Grossly unremarkable. Spleen: Unremarkable. Adrenals/Urinary Tract: No adrenal nodule seen. 3 mm nonobstructing right renal calculus. Multiple bilateral low-density renal lesions likely representing cysts, largest measuring to 2.0 cm in the left kidney. No hydronephrosis. Urinary bladder is decompressed. Stomach/Bowel: There is massive distention of the cecum measuring up to 13 cm in diameter with air-fluid level. There is swirling of the mesentery within the right upper quadrant with large bowel transition point (series 5, images 31-37). Findings are compatible with a cecal  volvulus. There is pneumatosis within the dilated cecum most notably along the posterior wall within the pelvis (series 2, image 64). Multiple tiny punctate foci of air anteriorly suggesting bowel perforation (series 2, images 59-60). Some of these foci of air appear to be within the subcutaneous soft tissues as well. Remainder of the bowel is largely decompressed. Postsurgical changes in the region of the sigmoid colon. Vascular/Lymphatic: Aortoiliac atherosclerosis without aneurysm. No lymphadenopathy is seen. Reproductive: Uterus not visualized, likely surgically absent. Other: Small amount of free fluid within the pelvis. Scattered subcutaneous emphysema noted along the anterior abdominal wall (series 2, images 49-52). Musculoskeletal: Moderate age indeterminate compression fracture of the L3 vertebral body.  Multilevel lumbar spondylosis. IMPRESSION: 1. Massively dilated cecum with swirling of the mesentery in the right upper quadrant compatible with a cecal volvulus. There is pneumatosis and evidence of perforation with a tiny amount of extraluminal free air. Emergent surgical consultation is recommended. 2. Age-indeterminate compression fracture of L3. 3. Additional findings, as above. These results were called by telephone at the time of interpretation on 02/07/2020 at 3:53 pm to provider Lavonna Lampron [facility] , who verbally acknowledged these results. Electronically Signed   By: Duanne Guess D.O.   On: 02/07/2020 15:59    Procedures Procedures (including critical care time)  Medications Ordered in ED Medications  morphine 2 MG/ML injection 2 mg (2 mg Intravenous Given 02/07/20 1620)  iohexol (OMNIPAQUE) 300 MG/ML solution 75 mL (75 mLs Intravenous Contrast Given 02/07/20 1525)  sodium chloride (PF) 0.9 % injection (  Given by Other 02/07/20 1620)  piperacillin-tazobactam (ZOSYN) IVPB 3.375 g (0 g Intravenous Stopped 02/07/20 1651)    ED Course  I have reviewed the triage vital signs and the nursing  notes.  Pertinent labs & imaging results that were available during my care of the patient were reviewed by me and considered in my medical decision making (see chart for details).  86 yo female presenting to ED with abdominal pain.  Hx of cecal volvulus.  Patient is DNR/DNI with advanced dementia.  On exam has some mild abd distension and diffuse tenderness (mild).  No rigidity.  She was complaining of pain on her arrival but this improved prior to being given any pain medications.  CT showing concerning signs of perforation of her volvulus.  Ordered IV zosyn in the ED.  Surgery consulted.  Discussion with niece (PoA) noted below.  Very likely a poor surgical candidate, and family aware of this situation.  They would like to discuss options with the surgeon including possible palliative/comfort care.  Clinical Course as of Feb 06 1805  Fri Feb 07, 2020  1608 IMPRESSION: 1. Massively dilated cecum with swirling of the mesentery in the right upper quadrant compatible with a cecal volvulus. There is pneumatosis and evidence of perforation with a tiny amount of extraluminal free air. Emergent surgical consultation is recommended. 2. Age-indeterminate compression fracture of L3. 3. Additional findings, as above.     [MT]  1608 Surgery consulted.  Spoke to her niece who is POA, who will be coming in.  She wants to talk to the surgery team to hear their options.  Patient is DNR/DNI as of last week, but not yet hospice   [MT]  1618 Spoke to surgery PA jennings, will discuss with attending and patient's niece   [MT]    Clinical Course User Index [MT] Arnetta Odeh, Kermit Balo, MD    Final Clinical Impression(s) / ED Diagnoses Final diagnoses:  Cecal volvulus [facility])  Palliative care encounter    Rx / DC Orders ED Discharge Orders    None       Terald Sleeper, MD 02/07/20 1806

## 2020-02-07 NOTE — Consult Note (Addendum)
[facility] Surgery Consult Note  Katherine Wilson [birth year]  [id number].    Requesting MD: Octaviano Glow Chief Complaint: Abdominal pain x2 days. Reason for Consult: Cecal volvulus with pneumatosis and evidence of perforation Patient resides at [place] skilled nursing facility  HPI: Patient is a 86 year old female with a history of significant Alzheimer's disease with dementia, type 2 diabetes, major depression and a history of volvulus.  She was hospitalized 12/27-12/30/2019 after an unwitnessed fall.  She also has a hx of multiple prior falls.  During that hospitalization she was treated for acute burst fractures of T3 with retropulsion. Today she presents with just a 2-day history of abdominal pain and abnormal labs.  Currently she is lying on her side was significant abdominal distention, mildly tender.  She is in no acute distress.  Work-up in the ED shows she is afebrile, vital signs are stable.  Potassium is 3.1, glucose is 232, BUN is 25 total bilirubin is 1.3 the remainder the CMP is normal.  WBC 13.3, hemoglobin 13.7, hematocrit 42.1, platelets 245,000.  Urinalysis shows greater than 50 red cells per high-powered field.  CT scan shows a massively dilated cecum with swirling of the mesentery in the right upper quadrant compatible with a cecal volvulus.  There is pneumatosis and evidence of perforation with a tiny amount of extraluminal free air.  We are asked to see.     ROS: Review of Systems  Unable to perform ROS: Dementia    History reviewed. No pertinent family history.  Past Medical History:  Diagnosis Date  . Alzheimer disease ([place])   . Dementia ([place])   . Diabetes mellitus without complication ([place])   . Major depression   . Volvulus ([place])     History reviewed. No pertinent surgical history.  Social History:  reports that she has never smoked. She has never used smokeless tobacco. She reports previous alcohol use. She reports previous drug  use.  Allergies:  Allergies  Allergen Reactions  . Chicken Allergy Other (See Comments)    Per MAR - unknown reaction  . Eggs Or Egg-Derived Products Other (See Comments)    Per MAR - unknown reaction  . Latex Other (See Comments)    Per MAR - unknown reaction    Prior to Admission medications   Medication Sig Start Date End Date Taking? Authorizing Provider  acetaminophen (TYLENOL) 325 MG tablet Take 2 tablets (650 mg total) by mouth every 4 (four) hours as needed (pain). 11/19/18   Patrecia Pour, MD  Amitriptyline HCl (ELAVIL PO) Take 12.5 mg by mouth at bedtime.    [provider]  donepezil (ARICEPT) 10 MG tablet Take 10 mg by mouth at bedtime.    [provider]  ENSURE (ENSURE) Take 237 mLs by mouth daily after supper.    [provider]  insulin lispro (HUMALOG) 100 UNIT/ML injection Inject 0-14 Units into the skin 2 (two) times daily. Per sliding scale, 200 = 0 units 201-250 = 2 units 251-300 = 4 units 301-350 = 6 units 351-400 = 8 units 401-450 = 10 units 451-500 = 12 units If high = 14 units and recheck in 2 hours If above 400 or below 100 = call MD    [provider]  memantine (NAMENDA) 10 MG tablet Take 10 mg by mouth 2 (two) times daily.    [provider]  sertraline (ZOLOFT) 25 MG tablet Take 25 mg by mouth daily.    [provider]  Blood pressure 136/81, pulse 74, temperature 98.1 F (36.7 C), resp. rate 18, SpO2 96 %. Physical Exam: General: Frail, cachectic 86 year old lying on her side.  She has a wick in place.  She thinks she is in [place] at some medical facility.  She does not know exactly where she is.  She thinks the year is 2079.  HEENT: head is normocephalic, atraumatic.  Sclera are noninjected.  Pupils are equal.  Ears and nose without any masses or lesions.  Mouth is pink and moist Heart: regular, rate, and rhythm.  Normal s1,s2. No obvious murmurs, gallops, or rubs noted.  Palpable  radial and pedal pulses bilaterally Lungs: CTAB, no wheezes, rhonchi, or rales noted.  Respiratory effort nonlabored Abd: Abdomen is distended, mildly tender, she has a well-healed midline surgical scar she cannot remember what that is from.  No bowel sounds. MS: all 4 extremities are symmetrical with no cyanosis, clubbing, or edema. Skin: warm and dry with no masses, lesions, or rashes Neuro: Cranial nerves 2-12 grossly intact, sensation is normal throughout Psych: Patient with significant dementia.  As noted above she thinks the year is 2079.  She is not sure where she is.  She does not know the month, she cannot tell me where she lives.  She does not know who the president is.  Results for orders placed or performed during the hospital encounter of 02/07/20 (from the past 48 hour(s))  Comprehensive metabolic panel     Status: Abnormal   Collection Time: 02/07/20  1:23 PM  Result Value Ref Range   Sodium 139 135 - 145 mmol/L   Potassium 3.1 (L) 3.5 - 5.1 mmol/L   Chloride 101 98 - 111 mmol/L   CO2 26 22 - 32 mmol/L   Glucose, Bld 232 (H) 70 - 99 mg/dL    Comment: Glucose reference range applies only to samples taken after fasting for at least 8 hours.   BUN 25 (H) 8 - 23 mg/dL   Creatinine, Ser 5.91 0.44 - 1.00 mg/dL   Calcium 8.9 8.9 - 63.8 mg/dL   Total Protein 6.9 6.5 - 8.1 g/dL   Albumin 4.0 3.5 - 5.0 g/dL   AST 19 15 - 41 U/L   ALT 17 0 - 44 U/L   Alkaline Phosphatase 48 38 - 126 U/L   Total Bilirubin 1.3 (H) 0.3 - 1.2 mg/dL   GFR calc non Af Amer >60 >60 mL/min   GFR calc Af Amer >60 >60 mL/min   Anion gap 12 5 - 15    Comment: Performed at [facility], [address], [place], [place] [address]  CBC with Differential     Status: Abnormal   Collection Time: 02/07/20  1:23 PM  Result Value Ref Range   WBC 13.3 (H) 4.0 - 10.5 K/uL   RBC 4.54 3.87 - 5.11 MIL/uL   Hemoglobin 13.7 12.0 - 15.0 g/dL   HCT 93.5 70.1 - 77.9 %   MCV 92.7 80.0 - 100.0 fL   MCH  30.2 26.0 - 34.0 pg   MCHC 32.5 30.0 - 36.0 g/dL   RDW 39.0 30.0 - 92.3 %   Platelets 245 150 - 400 K/uL   nRBC 0.0 0.0 - 0.2 %   Neutrophils Relative % 86 %   Neutro Abs 11.5 (H) 1.7 - 7.7 K/uL   Lymphocytes Relative 6 %   Lymphs Abs 0.7 0.7 - 4.0 K/uL   Monocytes Relative 8 %   Monocytes Absolute 1.1 (H)  0.1 - 1.0 K/uL   Eosinophils Relative 0 %   Eosinophils Absolute 0.0 0.0 - 0.5 K/uL   Basophils Relative 0 %   Basophils Absolute 0.0 0.0 - 0.1 K/uL   Immature Granulocytes 0 %   Abs Immature Granulocytes 0.05 0.00 - 0.07 K/uL    Comment: Performed at [facility], [address] [place], [place] [address]  Urinalysis, Routine w reflex microscopic     Status: Abnormal   Collection Time: 02/07/20  2:27 PM  Result Value Ref Range   Color, Urine YELLOW YELLOW   APPearance CLEAR CLEAR   Specific Gravity, Urine 1.029 1.005 - 1.030   pH 7.0 5.0 - 8.0   Glucose, UA 50 (A) NEGATIVE mg/dL   Hgb urine dipstick MODERATE (A) NEGATIVE   Bilirubin Urine NEGATIVE NEGATIVE   Ketones, ur 80 (A) NEGATIVE mg/dL   Protein, ur 235 (A) NEGATIVE mg/dL   Nitrite NEGATIVE NEGATIVE   Leukocytes,Ua TRACE (A) NEGATIVE   RBC / HPF >50 (H) 0 - 5 RBC/hpf   WBC, UA 0-5 0 - 5 WBC/hpf   Bacteria, UA RARE (A) NONE SEEN   Squamous Epithelial / LPF 0-5 0 - 5   Mucus PRESENT     Comment: Performed at [facility], [address], [place], [place] [address]   CT ABDOMEN PELVIS W CONTRAST  Result Date: 02/07/2020 CLINICAL DATA:  Abdominal distension EXAM: CT ABDOMEN AND PELVIS WITH CONTRAST TECHNIQUE: Multidetector CT imaging of the abdomen and pelvis was performed using the standard protocol following bolus administration of intravenous contrast. CONTRAST:  78mL OMNIPAQUE IOHEXOL 300 MG/ML  SOLN COMPARISON:  None. FINDINGS: Lower chest: Bibasilar atelectasis, right greater than left. Normal heart size. Hepatobiliary: Mass effect on the anterior aspect of the right hepatic  lobe from dilated large bowel and mesenteric fat. Multiple scattered low-density lesions within the liver including one within the anterior aspect of the left hepatic lobe containing a coarse calcification. Prior cholecystectomy. Pancreas: Grossly unremarkable. Spleen: Unremarkable. Adrenals/Urinary Tract: No adrenal nodule seen. 3 mm nonobstructing right renal calculus. Multiple bilateral low-density renal lesions likely representing cysts, largest measuring to 2.0 cm in the left kidney. No hydronephrosis. Urinary bladder is decompressed. Stomach/Bowel: There is massive distention of the cecum measuring up to 13 cm in diameter with air-fluid level. There is swirling of the mesentery within the right upper quadrant with large bowel transition point (series 5, images 31-37). Findings are compatible with a cecal volvulus. There is pneumatosis within the dilated cecum most notably along the posterior wall within the pelvis (series 2, image 64). Multiple tiny punctate foci of air anteriorly suggesting bowel perforation (series 2, images 59-60). Some of these foci of air appear to be within the subcutaneous soft tissues as well. Remainder of the bowel is largely decompressed. Postsurgical changes in the region of the sigmoid colon. Vascular/Lymphatic: Aortoiliac atherosclerosis without aneurysm. No lymphadenopathy is seen. Reproductive: Uterus not visualized, likely surgically absent. Other: Small amount of free fluid within the pelvis. Scattered subcutaneous emphysema noted along the anterior abdominal wall (series 2, images 49-52). Musculoskeletal: Moderate age indeterminate compression fracture of the L3 vertebral body. Multilevel lumbar spondylosis. IMPRESSION: 1. Massively dilated cecum with swirling of the mesentery in the right upper quadrant compatible with a cecal volvulus. There is pneumatosis and evidence of perforation with a tiny amount of extraluminal free air. Emergent surgical consultation is recommended.  2. Age-indeterminate compression fracture of L3. 3. Additional findings, as above. These results were called by  telephone at the time of interpretation on 02/07/2020 at 3:53 pm to provider MATTHEW TRIFAN , who verbally acknowledged these results. Electronically Signed   By: Duanne Guess D.O.   On: 02/07/2020 15:59      Assessment/Plan Alzheimer's with severe dementia Type 2 diabetes Hx of major depression Hx of volvulus History of multiple falls Hx burst fracture of T3 vertebrae Recent DNR/DNI per family Deconditioning Severe malnutrition  Cecal volvulus with perforation   Plan: Patient has a difficult problem.  Agree with initiation of antibiotics, NPO, and conservative management.  We will review with Dr. Luisa Hart hand Dr. Maisie Fus.  The niece who is POA Dara Lords is coming in and would like to discuss options.  Her phone number is [phone].  She has recently been made DNR/DNI, per the ER physician whose already talked with the POA.   Sherrie George [facility] Surgery 02/07/2020, 4:14 PM Please see Amion for pager number during day hours 7:00am-4:30pm

## 2020-02-07 NOTE — H&P (Signed)
History and Physical    Katherine Wilson YSA:[id number] DOB: 1923/01/29 DOA: 02/07/2020  PCP: System, Pcp Not In  Patient coming from: [facility]  I have personally briefly reviewed patient's old medical records in [place]  Chief Complaint: Abdominal pain  HPI: Katherine Wilson is a 86 y.o. female with medical history significant for Alzheimer's dementia, type 2 diabetes, depression, and history of volvulus who presents to the ED for evaluation of abdominal pain.  Patient unable to provide history due to dementia therefore entirety of history is obtained from EDP, chart review, and patient's niece/POA Celene Skeen at bedside.  Patient was sent from [facility] for reported abdominal pain over the last 2 days without nausea and vomiting.  She had a reported fall 2 days ago and has been having pain at her left hip.  Per niece, patient has significant dementia with profound short-term memory deficits.  ED Course:  Initial vitals showed BP 136/81, pulse 74, RR 18, temp 98.1 Fahrenheit, SPO2 96% on room air.  Labs show sodium 139, potassium 3.1, bicarb 26, BUN 25, creatinine 0.61, WBC 13.3, hemoglobin 13.7, platelets 245,000, AST 19, ALT 17, alk phos 48, total bilirubin 1.3.  SARS-CoV-2 PCR and influenza A/B PCR's are negative.  CT abdomen/pelvis with contrast showed a cecal volvulus with pneumatosis and evidence of perforation and tiny amount of extraluminal free air.  Patient was given IV Zosyn and morphine.  General surgery were consulted and after discussion with patient's POA, Celene Skeen, decision was made to proceed towards comfort care/hospice.  The hospitalist service was consulted to admit for further evaluation and management.  Review of Systems:  Unable to obtain full review of systems due to patient's dementia.   Past Medical History:  Diagnosis Date  . Alzheimer disease ([place])   . Dementia ([place])   . Diabetes mellitus without complication ([place])   . Major  depression   . Volvulus ([place])     History reviewed. No pertinent surgical history.  Social History:  reports that she has never smoked. She has never used smokeless tobacco. She reports previous alcohol use. She reports previous drug use.  Allergies  Allergen Reactions  . Chicken Allergy Other (See Comments)    Per MAR - unknown reaction  . Eggs Or Egg-Derived Products Other (See Comments)    Per MAR - unknown reaction  . Latex Other (See Comments)    Per MAR - unknown reaction    History reviewed. No pertinent family history.   Prior to Admission medications   Medication Sig Start Date End Date Taking? Authorizing Provider  acetaminophen (TYLENOL) 325 MG tablet Take 2 tablets (650 mg total) by mouth every 4 (four) hours as needed (pain). 11/19/18   Patrecia Pour, MD  Amitriptyline HCl (ELAVIL PO) Take 12.5 mg by mouth at bedtime.    [provider]  donepezil (ARICEPT) 10 MG tablet Take 10 mg by mouth at bedtime.    [provider]  ENSURE (ENSURE) Take 237 mLs by mouth daily after supper.    [provider]  insulin lispro (HUMALOG) 100 UNIT/ML injection Inject 0-14 Units into the skin 2 (two) times daily. Per sliding scale, 200 = 0 units 201-250 = 2 units 251-300 = 4 units 301-350 = 6 units 351-400 = 8 units 401-450 = 10 units 451-500 = 12 units If high = 14 units and recheck in 2 hours If above 400 or below 100 = call MD    [provider]  memantine (NAMENDA) 10 MG tablet Take 10 mg by mouth 2 (two) times daily.    [provider]  sertraline (ZOLOFT) 25 MG tablet Take 25 mg by mouth daily.    [provider]    Physical Exam: Vitals:   02/07/20 1430 02/07/20 1500 02/07/20 1600 02/07/20 1630  BP: 137/74 126/73 (!) 165/75 132/69  Pulse: 83 84 82 89  Resp:  '18 18 18  ' Temp:      SpO2: 96% 91% 96% 95%   Constitutional: Chronically ill-appearing woman resting supine in bed, NAD, calm, comfortable Eyes: PERRL,  lids and conjunctivae normal ENMT: Mucous membranes are dry. Posterior pharynx clear of any exudate or lesions. Neck: normal, supple, no masses. Respiratory: clear to auscultation anteriorly, no wheezing, no crackles. Normal respiratory effort. No accessory muscle use.  Cardiovascular: Regular rate and rhythm, no murmurs / rubs / gallops. No extremity edema. 2+ pedal pulses. Abdomen: Mild central and left lower quadrant tenderness, no masses palpated. No hepatosplenomegaly. Bowel sounds diminished.  Musculoskeletal: no clubbing / cyanosis. No joint deformity upper and lower extremities. Good ROM, no contractures. Normal muscle tone.  Skin: no rashes, lesions, ulcers. No induration Neurologic: CN 2-12 grossly intact. Sensation intact, moving all extremities equally Psychiatric: Awake and alert but otherwise unable to provide significant history due to dementia.   Labs on Admission: I have personally reviewed following labs and imaging studies  CBC: Recent Labs  Lab 02/07/20 1323  WBC 13.3*  NEUTROABS 11.5*  HGB 13.7  HCT 42.1  MCV 92.7  PLT 976   Basic Metabolic Panel: Recent Labs  Lab 02/07/20 1323  NA 139  K 3.1*  CL 101  CO2 26  GLUCOSE 232*  BUN 25*  CREATININE 0.61  CALCIUM 8.9   GFR: CrCl cannot be calculated (Unknown ideal weight.). Liver Function Tests: Recent Labs  Lab 02/07/20 1323  AST 19  ALT 17  ALKPHOS 48  BILITOT 1.3*  PROT 6.9  ALBUMIN 4.0   No results for input(s): LIPASE, AMYLASE in the last 168 hours. No results for input(s): AMMONIA in the last 168 hours. Coagulation Profile: No results for input(s): INR, PROTIME in the last 168 hours. Cardiac Enzymes: No results for input(s): CKTOTAL, CKMB, CKMBINDEX, TROPONINI in the last 168 hours. BNP (last 3 results) No results for input(s): PROBNP in the last 8760 hours. HbA1C: No results for input(s): HGBA1C in the last 72 hours. CBG: No results for input(s): GLUCAP in the last 168 hours. Lipid  Profile: No results for input(s): CHOL, HDL, LDLCALC, TRIG, CHOLHDL, LDLDIRECT in the last 72 hours. Thyroid Function Tests: No results for input(s): TSH, T4TOTAL, FREET4, T3FREE, THYROIDAB in the last 72 hours. Anemia Panel: No results for input(s): VITAMINB12, FOLATE, FERRITIN, TIBC, IRON, RETICCTPCT in the last 72 hours. Urine analysis:    Component Value Date/Time   COLORURINE YELLOW 02/07/2020 1427   APPEARANCEUR CLEAR 02/07/2020 1427   LABSPEC 1.029 02/07/2020 1427   PHURINE 7.0 02/07/2020 1427   GLUCOSEU 50 (A) 02/07/2020 1427   HGBUR MODERATE (A) 02/07/2020 1427   BILIRUBINUR NEGATIVE 02/07/2020 1427   KETONESUR 80 (A) 02/07/2020 1427   PROTEINUR 100 (A) 02/07/2020 1427   NITRITE NEGATIVE 02/07/2020 1427   LEUKOCYTESUR TRACE (A) 02/07/2020 1427    Radiological Exams on Admission: CT ABDOMEN PELVIS W CONTRAST  Result Date: 02/07/2020 CLINICAL DATA:  Abdominal distension EXAM: CT ABDOMEN AND PELVIS WITH CONTRAST TECHNIQUE: Multidetector CT imaging of the abdomen and pelvis was performed using the standard protocol following  bolus administration of intravenous contrast. CONTRAST:  60m OMNIPAQUE IOHEXOL 300 MG/ML  SOLN COMPARISON:  None. FINDINGS: Lower chest: Bibasilar atelectasis, right greater than left. Normal heart size. Hepatobiliary: Mass effect on the anterior aspect of the right hepatic lobe from dilated large bowel and mesenteric fat. Multiple scattered low-density lesions within the liver including one within the anterior aspect of the left hepatic lobe containing a coarse calcification. Prior cholecystectomy. Pancreas: Grossly unremarkable. Spleen: Unremarkable. Adrenals/Urinary Tract: No adrenal nodule seen. 3 mm nonobstructing right renal calculus. Multiple bilateral low-density renal lesions likely representing cysts, largest measuring to 2.0 cm in the left kidney. No hydronephrosis. Urinary bladder is decompressed. Stomach/Bowel: There is massive distention of the cecum  measuring up to 13 cm in diameter with air-fluid level. There is swirling of the mesentery within the right upper quadrant with large bowel transition point (series 5, images 31-37). Findings are compatible with a cecal volvulus. There is pneumatosis within the dilated cecum most notably along the posterior wall within the pelvis (series 2, image 64). Multiple tiny punctate foci of air anteriorly suggesting bowel perforation (series 2, images 59-60). Some of these foci of air appear to be within the subcutaneous soft tissues as well. Remainder of the bowel is largely decompressed. Postsurgical changes in the region of the sigmoid colon. Vascular/Lymphatic: Aortoiliac atherosclerosis without aneurysm. No lymphadenopathy is seen. Reproductive: Uterus not visualized, likely surgically absent. Other: Small amount of free fluid within the pelvis. Scattered subcutaneous emphysema noted along the anterior abdominal wall (series 2, images 49-52). Musculoskeletal: Moderate age indeterminate compression fracture of the L3 vertebral body. Multilevel lumbar spondylosis. IMPRESSION: 1. Massively dilated cecum with swirling of the mesentery in the right upper quadrant compatible with a cecal volvulus. There is pneumatosis and evidence of perforation with a tiny amount of extraluminal free air. Emergent surgical consultation is recommended. 2. Age-indeterminate compression fracture of L3. 3. Additional findings, as above. These results were called by telephone at the time of interpretation on 02/07/2020 at 3:53 pm to provider [facility], who verbally acknowledged these results. Electronically Signed   By: NDavina PokeD.O.   On: 02/07/2020 15:59    EKG: Not performed.  Assessment/Plan Active Problems:   Dementia ([place]   Type 2 diabetes mellitus (HCC)   Major depression   Cecal volvulus (HCC)   Comfort measures only status  GGibraltarAllison is a 86y.o. female with medical history significant for Alzheimer's  dementia, type 2 diabetes, depression, and history of volvulus who is found to have a cecal volvulus and is admitted for palliative/comfort care with anticipated transition to hospice.  Cecal volvulus: Seen by general surgery, after discussion with patient's niece/POA RCelene Skeen it was determined that risk of mortality is high with or without surgery and that surgery would more than likely worsen her current poor quality of life.  The decision was made to focus on patient's comfort/quality of remaining life and transition to hospice care.  Comfort care measures/dementia/depression: -Pain control with oral OxyIR and IV morphine as needed -Can place on IV morphine infusion if pain is uncontrolled -Continue supportive care with as needed Ativan for anxiety, Haldol for agitation/delirium, Thorazine for hiccoughs -Delirium precautions, fall precautions -We will continue home Namenda, Aricept, and Zoloft -CODE STATUS is DNR/DNI -TOC consult placed for hospice placement  Type 2 diabetes: Continue sliding scale insulin for now.  DVT prophylaxis: None Code Status: DNR/DNI, confirmed with patient's niece/POA RCelene SkeenFamily Communication: Discussed with patient's niece/POA RCelene Skeenat bedside Disposition  Plan: From [place] assisted living facility.  Likely discharge to hospice. Consults called: General surgery Admission status: Inpatient for palliative/comfort care measures.   Zada Finders MD [facility]  If 7PM-7AM, please contact night-coverage [email or web address]  02/07/2020, 6:32 PM

## 2020-02-07 NOTE — ED Notes (Signed)
Pts POA Dara Lords called for update. This RN updated on pt status

## 2020-02-07 NOTE — ED Notes (Signed)
[facility] transported [facility]

## 2020-02-07 NOTE — ED Triage Notes (Addendum)
Pt arrived via [facility] from [facility] CC Abd pain X 2 days and abnormal labs. Staff did not report NVD , only generalized abd pain. EMS/pt unsure if pt prescribed blood thinners.   Lab paperwork at bedside   Hx fall 2 days ago, dementia, diabetes   Paperwork notes POA Dara Lords [phone]

## 2020-02-07 NOTE — ED Notes (Signed)
Transport called to [facility] to [facility]. Pt belongings at bedside.

## 2020-02-07 NOTE — ED Provider Notes (Signed)
  Physical Exam  BP 132/69   Pulse 89   Temp 98.1 F (36.7 C)   Resp 18   SpO2 95%   Physical Exam  ED Course/Procedures   Clinical Course as of Feb 06 1757  Fri Feb 07, 2020  1608 IMPRESSION: 1. Massively dilated cecum with swirling of the mesentery in the right upper quadrant compatible with a cecal volvulus. There is pneumatosis and evidence of perforation with a tiny amount of extraluminal free air. Emergent surgical consultation is recommended. 2. Age-indeterminate compression fracture of L3. 3. Additional findings, as above.     [MT]  1608 Surgery consulted.  Spoke to her niece who is POA, who will be coming in.  She wants to talk to the surgery team to hear their options.  Patient is DNR/DNI as of last week, but not yet hospice   [MT]  1618 Spoke to surgery PA jennings, will discuss with attending and patient's niece   [MT]    Clinical Course User Index [MT] Trifan, Kermit Balo, MD    Procedures  MDM  Discussed with Dr. Maisie Fus.  She had talked with the patient's niece who is power of attorney.  Patient is DNR and decision was made to go to comfort care.  Will bring in the hospital for comfort care and to help get disposition in order.      Benjiman Core, MD 02/07/20 1759

## 2020-02-07 NOTE — Progress Notes (Signed)
Discussed situation with pt's POA, Dara Lords.  She had surgery for what sounds like a cecal volvulus ~18 months ago in [place].  She has had a significant decline in mental status and QOL since then.  We discussed that this appears to be a recurrence and the risks of death with or without surgery are very high.  We discussed that quality of life after a 2nd surgery would most likely be worse than it is now if she did recover.  Pt's POA does not want to put her through that again.  She has elected to proceed with hospice for the pt.  All questions were answered.  Will turn over care to ED physicians and medicine/palliative team and sign off.    Vanita Panda, MD  Colorectal and General Surgery [facility] Surgery

## 2020-02-07 NOTE — ED Notes (Signed)
[facility] education provided 

## 2020-02-08 DIAGNOSIS — K562 Volvulus: Secondary | ICD-10-CM

## 2020-02-08 DIAGNOSIS — Z515 Encounter for palliative care: Principal | ICD-10-CM

## 2020-02-08 DIAGNOSIS — F0281 Dementia in other diseases classified elsewhere with behavioral disturbance: Secondary | ICD-10-CM

## 2020-02-08 DIAGNOSIS — G309 Alzheimer's disease, unspecified: Secondary | ICD-10-CM

## 2020-02-08 LAB — GLUCOSE, CAPILLARY
Glucose-Capillary: 190 mg/dL — ABNORMAL HIGH (ref 70–99)
Glucose-Capillary: 157 mg/dL — ABNORMAL HIGH (ref 70–99)

## 2020-02-08 NOTE — Progress Notes (Addendum)
Patient discharged to [facility] via [facility]. Patient sent with IV per Hospice request.

## 2020-02-08 NOTE — Discharge Summary (Signed)
Physician Discharge Summary  Katherine Wilson SFK:[id number] DOB: 12/24/1922 DOA: 02/07/2020  PCP: System, Pcp Not In  Admit date: 02/07/2020 Discharge date: 02/08/2020  Admitted From: ALF Disposition: Residential Hospice   Recommendations for Outpatient Follow-up:  1. Comfort measures  Home Health: NA Equipment/Devices: NA Discharge Condition: Stable for transfer CODE STATUS: DNR Diet recommendation: As desired  Brief/Interim Summary: Katherine Allisonis a 86 y.o.femalewith medical history significant forAlzheimer's dementia, T2DM, depression, and history of volvulus ~18 months ago who presented from ALF to the ED for evaluation of abdominal pain on 3/19.  Initially was afebrile with stable vital signs. Labs demonstrated hypokalemia and neutrophilic leukocytosis. CT abdomen/pelvis demonstrated cecal volvulus with evidence of perforation. Surgery was consulted, and in light of significant advancement of her dementia as well is diminished quality of life since her last surgery, as well as a very high risk of death with or without surgical management at this time, the patient's HCPOA voiced the desire to transition toward comfort measures and residential hospice placement. This is being pursued.   Discharge Diagnoses:  Active Problems:   Dementia ([place])   Type 2 diabetes mellitus ([place])   Major depression   Cecal volvulus (HCC)   Comfort measures only status DNR present on arrival/admission Volvulus with perforation  Discharge Instructions  Allergies as of 02/08/2020      Reactions   Chicken Allergy Other (See Comments)   Per MAR - unknown reaction   Eggs Or Egg-derived Products Other (See Comments)   Per MAR - unknown reaction   Latex Other (See Comments)   Per MAR - unknown reaction      Medication List    STOP taking these medications   bisacodyl 10 MG suppository Commonly known as: DULCOLAX   calcium-vitamin D 500-200 MG-UNIT tablet Commonly known as: OSCAL WITH D    cholecalciferol 25 MCG (1000 UNIT) tablet Commonly known as: VITAMIN D3   donepezil 10 MG tablet Commonly known as: ARICEPT   feeding supplement Liqd Commonly known as: BOOST / RESOURCE BREEZE   memantine 10 MG tablet Commonly known as: NAMENDA   ondansetron 4 MG tablet Commonly known as: ZOFRAN   simethicone 125 MG chewable tablet Commonly known as: MYLICON     TAKE these medications   acetaminophen 325 MG tablet Commonly known as: Tylenol Take 2 tablets (650 mg total) by mouth every 4 (four) hours as needed (pain). What changed: Another medication with the same name was removed. Continue taking this medication, and follow the directions you see here.   sertraline 25 MG tablet Commonly known as: ZOLOFT Take 12.5 mg by mouth daily.       Allergies  Allergen Reactions  . Chicken Allergy Other (See Comments)    Per MAR - unknown reaction  . Eggs Or Egg-Derived Products Other (See Comments)    Per MAR - unknown reaction  . Latex Other (See Comments)    Per MAR - unknown reaction    Consultations:  Surgery  Procedures/Studies: CT ABDOMEN PELVIS W CONTRAST  Result Date: 02/07/2020 CLINICAL DATA:  Abdominal distension EXAM: CT ABDOMEN AND PELVIS WITH CONTRAST TECHNIQUE: Multidetector CT imaging of the abdomen and pelvis was performed using the standard protocol following bolus administration of intravenous contrast. CONTRAST:  12mL OMNIPAQUE IOHEXOL 300 MG/ML  SOLN COMPARISON:  None. FINDINGS: Lower chest: Bibasilar atelectasis, right greater than left. Normal heart size. Hepatobiliary: Mass effect on the anterior aspect of the right hepatic lobe from dilated large bowel and mesenteric fat. Multiple scattered low-density lesions  within the liver including one within the anterior aspect of the left hepatic lobe containing a coarse calcification. Prior cholecystectomy. Pancreas: Grossly unremarkable. Spleen: Unremarkable. Adrenals/Urinary Tract: No adrenal nodule seen. 3 mm  nonobstructing right renal calculus. Multiple bilateral low-density renal lesions likely representing cysts, largest measuring to 2.0 cm in the left kidney. No hydronephrosis. Urinary bladder is decompressed. Stomach/Bowel: There is massive distention of the cecum measuring up to 13 cm in diameter with air-fluid level. There is swirling of the mesentery within the right upper quadrant with large bowel transition point (series 5, images 31-37). Findings are compatible with a cecal volvulus. There is pneumatosis within the dilated cecum most notably along the posterior wall within the pelvis (series 2, image 64). Multiple tiny punctate foci of air anteriorly suggesting bowel perforation (series 2, images 59-60). Some of these foci of air appear to be within the subcutaneous soft tissues as well. Remainder of the bowel is largely decompressed. Postsurgical changes in the region of the sigmoid colon. Vascular/Lymphatic: Aortoiliac atherosclerosis without aneurysm. No lymphadenopathy is seen. Reproductive: Uterus not visualized, likely surgically absent. Other: Small amount of free fluid within the pelvis. Scattered subcutaneous emphysema noted along the anterior abdominal wall (series 2, images 49-52). Musculoskeletal: Moderate age indeterminate compression fracture of the L3 vertebral body. Multilevel lumbar spondylosis. IMPRESSION: 1. Massively dilated cecum with swirling of the mesentery in the right upper quadrant compatible with a cecal volvulus. There is pneumatosis and evidence of perforation with a tiny amount of extraluminal free air. Emergent surgical consultation is recommended. 2. Age-indeterminate compression fracture of L3. 3. Additional findings, as above. These results were called by telephone at the time of interpretation on 02/07/2020 at 3:53 pm to provider MATTHEW [facility] , who verbally acknowledged these results. Electronically Signed   By: Duanne Guess D.O.   On: 02/07/2020 15:59      Subjective: Confused, says she can't get the pain to stop.   Discharge Exam: Vitals:   02/07/20 2003 02/08/20 1422  BP: 133/63 (!) 124/56  Pulse: 81 93  Resp: 18 18  Temp: 98.5 F (36.9 C) 98.2 F (36.8 C)  SpO2: 100% 95%   Gen: Frail elderly female in no acute distress GI: Abdomen soft, distended and very tender with guarding.    Labs: Basic Metabolic Panel: Recent Labs  Lab 02/07/20 1323  NA 139  K 3.1*  CL 101  CO2 26  GLUCOSE 232*  BUN 25*  CREATININE 0.61  CALCIUM 8.9   Liver Function Tests: Recent Labs  Lab 02/07/20 1323  AST 19  ALT 17  ALKPHOS 48  BILITOT 1.3*  PROT 6.9  ALBUMIN 4.0   CBC: Recent Labs  Lab 02/07/20 1323  WBC 13.3*  NEUTROABS 11.5*  HGB 13.7  HCT 42.1  MCV 92.7  PLT 245   Microbiology Recent Results (from the past 240 hour(s))  Respiratory Panel by RT PCR (Flu A&B, Covid) - Nasopharyngeal Swab     Status: None   Collection Time: 02/07/20  5:21 PM   Specimen: Nasopharyngeal Swab  Result Value Ref Range Status   SARS Coronavirus 2 by RT PCR NEGATIVE NEGATIVE Final    Comment: (NOTE) SARS-CoV-2 target nucleic acids are NOT DETECTED. The SARS-CoV-2 RNA is generally detectable in upper respiratoy specimens during the acute phase of infection. The lowest concentration of SARS-CoV-2 viral copies this assay can detect is 131 copies/mL. A negative result does not preclude SARS-Cov-2 infection and should not be used as the sole basis for treatment or  other patient management decisions. A negative result may occur with  improper specimen collection/handling, submission of specimen other than nasopharyngeal swab, presence of viral mutation(s) within the areas targeted by this assay, and inadequate number of viral copies (<131 copies/mL). A negative result must be combined with clinical observations, patient history, and epidemiological information. The expected result is Negative. Fact Sheet for Patients:   https://www.moore.com/ Fact Sheet for Healthcare Providers:  https://www.young.biz/ This test is not yet ap proved or cleared by the [place] FDA and  has been authorized for detection and/or diagnosis of SARS-CoV-2 by FDA under an Emergency Use Authorization (EUA). This EUA will remain  in effect (meaning this test can be used) for the duration of the COVID-19 declaration under Section 564(b)(1) of the Act, 21 U.S.C. section 360bbb-3(b)(1), unless the authorization is terminated or revoked sooner.    Influenza A by PCR NEGATIVE NEGATIVE Final   Influenza B by PCR NEGATIVE NEGATIVE Final    Comment: (NOTE) The Xpert Xpress SARS-CoV-2/FLU/RSV assay is intended as an aid in  the diagnosis of influenza from Nasopharyngeal swab specimens and  should not be used as a sole basis for treatment. Nasal washings and  aspirates are unacceptable for Xpert Xpress SARS-CoV-2/FLU/RSV  testing. Fact Sheet for Patients: https://www.moore.com/ Fact Sheet for Healthcare Providers: https://www.young.biz/ This test is not yet approved or cleared by the [place] FDA and  has been authorized for detection and/or diagnosis of SARS-CoV-2 by  FDA under an Emergency Use Authorization (EUA). This EUA will remain  in effect (meaning this test can be used) for the duration of the  Covid-19 declaration under Section 564(b)(1) of the Act, 21  U.S.C. section 360bbb-3(b)(1), unless the authorization is  terminated or revoked. Performed at [facility], [address] [place], [place] [address]     Time coordinating discharge: Approximately 40 minutes  Tyrone Nine, MD  [facility] 02/08/2020, 2:45 PM

## 2020-02-08 NOTE — Progress Notes (Signed)
PROGRESS NOTE  Katherine Wilson  YTK:[id number] DOB: 01-17-1923 DOA: 02/07/2020 PCP: System, Pcp Not In  Brief Narrative: Katherine Wilson is a 86 y.o. female with medical history significant for Alzheimer's dementia, T2DM, depression, and history of volvulus ~18 months ago who presented from ALF to the ED for evaluation of abdominal pain on 3/19.   Initially was afebrile with stable vital signs. Labs demonstrated hypokalemia and neutrophilic leukocytosis. CT abdomen/pelvis demonstrated cecal volvulus with evidence of perforation. Surgery was consulted, and in light of significant advancement of her dementia as well is diminished quality of life since her last surgery, as well as a very high risk of death with or without surgical management at this time, the patient's HCPOA voiced the desire to transition toward comfort measures and residential hospice placement. This is being pursued.   Assessment & Plan: Active Problems:   Dementia (HCC)   Type 2 diabetes mellitus (HCC)   Major depression   Cecal volvulus (HCC)   Comfort measures only status  Cecal volvulus: - Can stop zosyn as this is not contributing to comfort.  - Seen by general surgery, after discussion with patient's niece/POA Dara Lords, it was determined that risk of mortality is high with or without surgery and that surgery would more than likely worsen her current poor quality of life.  The decision was made to focus on patient's comfort/quality of remaining life and transition to hospice care.  Comfort care measures/dementia/depression: - Continue zoloft. Will DC aricept and namenda as they are not beneficial with this advanced stage of dementia and the patient now on hospice care.  - Continue IV pain control with morphine. Anticipate this to be the major prn needed, but also ordered for comfort measures include: ativan for anxiety, haldol for agitation/delirium, thorazine for hiccoughs, robinul for secretions, trazodone for  sleep - Delirium precautions, family to be allowed at bedside.  - D/w RNCM this morning who has called [facility] and placed referral. We're waiting to hear back.   Type 2 diabetes: Last HbA1c was 6.1% - Stop CBG checks.   DVT prophylaxis: Comfort measures Code Status: DNR POA Family Communication: Niece/HCPOA at bedside Disposition Plan: Residential hospice once bed available.   Subjective: Confused, says she can't get the pain to stop.   Objective: Vitals:   02/07/20 1800 02/07/20 1815 02/07/20 1830 02/07/20 2003  BP: (!) 143/55  (!) 148/55 133/63  Pulse: 75 70 74 81  Resp:   18 18  Temp:    98.5 F (36.9 C)  TempSrc:    Oral  SpO2: 97% 96% 97% 100%    Intake/Output Summary (Last 24 hours) at 02/08/2020 1349 Last data filed at 02/07/2020 1651 Gross per 24 hour  Intake 51.67 ml  Output --  Net 51.67 ml   There were no vitals filed for this visit.  Gen: Frail elderly female in no acute distress Pulm: Non-labored breathing room air. Clear to auscultation bilaterally.  CV: Regular rate and rhythm. No murmur, rub, or gallop. No JVD, no pedal edema. GI: Abdomen soft, distended and very tender with guarding.   Ext: Warm, no deformities Skin: No rashes, lesions or ulcers Neuro: Alert, disoriented.  Psych: Judgement and insight appear impaired.   Data Reviewed: I have personally reviewed following labs and imaging studies  CBC: Recent Labs  Lab 02/07/20 1323  WBC 13.3*  NEUTROABS 11.5*  HGB 13.7  HCT 42.1  MCV 92.7  PLT 245   Basic Metabolic Panel: Recent Labs  Lab 02/07/20  1323  NA 139  K 3.1*  CL 101  CO2 26  GLUCOSE 232*  BUN 25*  CREATININE 0.61  CALCIUM 8.9   GFR: CrCl cannot be calculated (Unknown ideal weight.). Liver Function Tests: Recent Labs  Lab 02/07/20 1323  AST 19  ALT 17  ALKPHOS 48  BILITOT 1.3*  PROT 6.9  ALBUMIN 4.0   No results for input(s): LIPASE, AMYLASE in the last 168 hours. No results for input(s): AMMONIA  in the last 168 hours. Coagulation Profile: No results for input(s): INR, PROTIME in the last 168 hours. Cardiac Enzymes: No results for input(s): CKTOTAL, CKMB, CKMBINDEX, TROPONINI in the last 168 hours. BNP (last 3 results) No results for input(s): PROBNP in the last 8760 hours. HbA1C: No results for input(s): HGBA1C in the last 72 hours. CBG: Recent Labs  Lab 02/07/20 2022 02/08/20 0743 02/08/20 1126  GLUCAP 202* 190* 157*   Lipid Profile: No results for input(s): CHOL, HDL, LDLCALC, TRIG, CHOLHDL, LDLDIRECT in the last 72 hours. Thyroid Function Tests: No results for input(s): TSH, T4TOTAL, FREET4, T3FREE, THYROIDAB in the last 72 hours. Anemia Panel: No results for input(s): VITAMINB12, FOLATE, FERRITIN, TIBC, IRON, RETICCTPCT in the last 72 hours. Urine analysis:    Component Value Date/Time   COLORURINE YELLOW 02/07/2020 1427   APPEARANCEUR CLEAR 02/07/2020 1427   LABSPEC 1.029 02/07/2020 1427   PHURINE 7.0 02/07/2020 1427   GLUCOSEU 50 (A) 02/07/2020 1427   HGBUR MODERATE (A) 02/07/2020 1427   BILIRUBINUR NEGATIVE 02/07/2020 1427   KETONESUR 80 (A) 02/07/2020 1427   PROTEINUR 100 (A) 02/07/2020 1427   NITRITE NEGATIVE 02/07/2020 1427   LEUKOCYTESUR TRACE (A) 02/07/2020 1427   Recent Results (from the past 240 hour(s))  Respiratory Panel by RT PCR (Flu A&B, Covid) - Nasopharyngeal Swab     Status: None   Collection Time: 02/07/20  5:21 PM   Specimen: Nasopharyngeal Swab  Result Value Ref Range Status   SARS Coronavirus 2 by RT PCR NEGATIVE NEGATIVE Final    Comment: (NOTE) SARS-CoV-2 target nucleic acids are NOT DETECTED. The SARS-CoV-2 RNA is generally detectable in upper respiratoy specimens during the acute phase of infection. The lowest concentration of SARS-CoV-2 viral copies this assay can detect is 131 copies/mL. A negative result does not preclude SARS-Cov-2 infection and should not be used as the sole basis for treatment or other patient management  decisions. A negative result may occur with  improper specimen collection/handling, submission of specimen other than nasopharyngeal swab, presence of viral mutation(s) within the areas targeted by this assay, and inadequate number of viral copies (<131 copies/mL). A negative result must be combined with clinical observations, patient history, and epidemiological information. The expected result is Negative. Fact Sheet for Patients:  https://www.moore.com/ Fact Sheet for Healthcare Providers:  https://www.young.biz/ This test is not yet ap proved or cleared by the [place] FDA and  has been authorized for detection and/or diagnosis of SARS-CoV-2 by FDA under an Emergency Use Authorization (EUA). This EUA will remain  in effect (meaning this test can be used) for the duration of the COVID-19 declaration under Section 564(b)(1) of the Act, 21 U.S.C. section 360bbb-3(b)(1), unless the authorization is terminated or revoked sooner.    Influenza A by PCR NEGATIVE NEGATIVE Final   Influenza B by PCR NEGATIVE NEGATIVE Final    Comment: (NOTE) The Xpert Xpress SARS-CoV-2/FLU/RSV assay is intended as an aid in  the diagnosis of influenza from Nasopharyngeal swab specimens and  should not be used  as a sole basis for treatment. Nasal washings and  aspirates are unacceptable for Xpert Xpress SARS-CoV-2/FLU/RSV  testing. Fact Sheet for Patients: PinkCheek.be Fact Sheet for Healthcare Providers: GravelBags.it This test is not yet approved or cleared by the [place] FDA and  has been authorized for detection and/or diagnosis of SARS-CoV-2 by  FDA under an Emergency Use Authorization (EUA). This EUA will remain  in effect (meaning this test can be used) for the duration of the  Covid-19 declaration under Section 564(b)(1) of the Act, 21  U.S.C. section 360bbb-3(b)(1), unless the authorization  is  terminated or revoked. Performed at [facility], [place] [address], [place], [place] [address]       Radiology Studies: CT ABDOMEN PELVIS W CONTRAST  Result Date: 02/07/2020 CLINICAL DATA:  Abdominal distension EXAM: CT ABDOMEN AND PELVIS WITH CONTRAST TECHNIQUE: Multidetector CT imaging of the abdomen and pelvis was performed using the standard protocol following bolus administration of intravenous contrast. CONTRAST:  88mL OMNIPAQUE IOHEXOL 300 MG/ML  SOLN COMPARISON:  None. FINDINGS: Lower chest: Bibasilar atelectasis, right greater than left. Normal heart size. Hepatobiliary: Mass effect on the anterior aspect of the right hepatic lobe from dilated large bowel and mesenteric fat. Multiple scattered low-density lesions within the liver including one within the anterior aspect of the left hepatic lobe containing a coarse calcification. Prior cholecystectomy. Pancreas: Grossly unremarkable. Spleen: Unremarkable. Adrenals/Urinary Tract: No adrenal nodule seen. 3 mm nonobstructing right renal calculus. Multiple bilateral low-density renal lesions likely representing cysts, largest measuring to 2.0 cm in the left kidney. No hydronephrosis. Urinary bladder is decompressed. Stomach/Bowel: There is massive distention of the cecum measuring up to 13 cm in diameter with air-fluid level. There is swirling of the mesentery within the right upper quadrant with large bowel transition point (series 5, images 31-37). Findings are compatible with a cecal volvulus. There is pneumatosis within the dilated cecum most notably along the posterior wall within the pelvis (series 2, image 64). Multiple tiny punctate foci of air anteriorly suggesting bowel perforation (series 2, images 59-60). Some of these foci of air appear to be within the subcutaneous soft tissues as well. Remainder of the bowel is largely decompressed. Postsurgical changes in the region of the sigmoid colon. Vascular/Lymphatic:  Aortoiliac atherosclerosis without aneurysm. No lymphadenopathy is seen. Reproductive: Uterus not visualized, likely surgically absent. Other: Small amount of free fluid within the pelvis. Scattered subcutaneous emphysema noted along the anterior abdominal wall (series 2, images 49-52). Musculoskeletal: Moderate age indeterminate compression fracture of the L3 vertebral body. Multilevel lumbar spondylosis. IMPRESSION: 1. Massively dilated cecum with swirling of the mesentery in the right upper quadrant compatible with a cecal volvulus. There is pneumatosis and evidence of perforation with a tiny amount of extraluminal free air. Emergent surgical consultation is recommended. 2. Age-indeterminate compression fracture of L3. 3. Additional findings, as above. These results were called by telephone at the time of interpretation on 02/07/2020 at 3:53 pm to provider MATTHEW [facility] , who verbally acknowledged these results. Electronically Signed   By: Davina Poke D.O.   On: 02/07/2020 15:59    Scheduled Meds: . donepezil  10 mg Oral QHS  . insulin aspart  0-9 Units Subcutaneous TID WC  . memantine  10 mg Oral BID  . sertraline  25 mg Oral Daily   Continuous Infusions: . chlorproMAZINE (THORAZINE) IV       LOS: 1 day   Time spent: 25 minutes.  Patrecia Pour, MD [facility] [email or web address] 02/08/2020, 1:49  PM  

## 2020-02-08 NOTE — Progress Notes (Signed)
[facility] [place].  Referral received from [place], [place] at [facility]. Discussed goals and Hospice criteria with family. Niece Lurena Joiner is in agreement with Hospice care. Spoke to our MD who approves pt for Hospice care. Pt can transfer today if MD in agreement.  Norm Parcel, RN [phone].

## 2020-02-08 NOTE — Progress Notes (Signed)
Report called to Alondra Park at [facility] [place]. Awaiting [facility] transport.

## 2020-02-08 NOTE — TOC Initial Note (Signed)
Transition of Care [facility]) - Initial/Assessment Note    Patient Details  Name: [place] Robart MRN: [id number] Date of Birth: February 25, 1923  Transition of Care (TOC) CM/SW Contact:    Armanda Heritage, RN Phone Number: 02/08/2020, 11:32 AM  Clinical Narrative:   CM spoke with MD who states patient will need residential hospice placement and family has chosen [facility] [place].  CM spoke with rep Pam and gave referral.  Rep to notify CM when bed is available.                 Expected Discharge Plan: Hospice Medical Facility Barriers to Discharge: Hospice Bed not available   Patient Goals and CMS Choice Patient states their goals for this hospitalization and ongoing recovery are:: to go to residential hospice CMS Medicare.gov Compare Post Acute Care list provided to:: Patient Represenative (must comment) Choice offered to / list presented to : [facility] POA / Guardian  Expected Discharge Plan and Services Expected Discharge Plan: Hospice Medical Facility   Discharge Planning Services: CM Consult Post Acute Care Choice: Hospice Living arrangements for the past 2 months: Skilled Nursing Facility                 DME Arranged: N/A DME Agency: NA       HH Arranged: NA HH Agency: NA        Prior Living Arrangements/Services Living arrangements for the past 2 months: Skilled Nursing Facility Lives with:: Facility Resident Patient language and need for interpreter reviewed:: Yes        Need for Family Participation in Patient Care: Yes (Comment) Care giver support system in place?: Yes (comment)   Criminal Activity/Legal Involvement Pertinent to Current Situation/Hospitalization: No - Comment as needed  Activities of Daily Living Home Assistive Devices/Equipment: Eyeglasses, Hearing aid, Wheelchair(bilateral hearing aides) ADL Screening (condition at time of admission) Patient's cognitive ability adequate to safely complete daily activities?: No Is the patient deaf or have  difficulty hearing?: Yes(bilateral hearing aides) Does the patient have difficulty seeing, even when wearing glasses/contacts?: Yes(is legally blind in right eye and has cataract also, had cataract surgery in left eye) Does the patient have difficulty concentrating, remembering, or making decisions?: Yes Patient able to express need for assistance with ADLs?: Yes Does the patient have difficulty dressing or bathing?: Yes Independently performs ADLs?: No Communication: Independent Dressing (OT): Needs assistance Is this a change from baseline?: Pre-admission baseline Grooming: Needs assistance Is this a change from baseline?: Pre-admission baseline Feeding: Independent Bathing: Needs assistance Is this a change from baseline?: Pre-admission baseline Toileting: Needs assistance Is this a change from baseline?: Pre-admission baseline In/Out Bed: Needs assistance Is this a change from baseline?: Pre-admission baseline Walks in Home: Needs assistance Is this a change from baseline?: Pre-admission baseline Does the patient have difficulty walking or climbing stairs?: Yes Weakness of Legs: Both Weakness of Arms/Hands: Both  Permission Sought/Granted                  Emotional Assessment           Psych Involvement: No (comment)  Admission diagnosis:  Cecal volvulus (HCC) [K56.2] Palliative care encounter [Z51.5] Patient Active Problem List   Diagnosis Date Noted  . Cecal volvulus (HCC) 02/07/2020  . Comfort measures only status 02/07/2020  . Burst fracture of thoracic vertebra, closed, initial encounter (HCC) 11/16/2018  . Dementia (HCC) 11/16/2018  . Type 2 diabetes mellitus (HCC) 11/16/2018  . Hypocalcemia 11/16/2018  . Major depression 11/16/2018  PCP:  System, Pcp Not In Pharmacy:  No Pharmacies Listed    Social Determinants of Health (SDOH) Interventions    Readmission Risk Interventions No flowsheet data found.

## 2020-02-08 NOTE — TOC Progression Note (Signed)
Transition of Care [facility]) - Progression Note    Patient Details  Name: Katherine Wilson MRN: [id number] Date of Birth: 04-03-1923  [facility] CM/SW Contact  Armanda Heritage, RN Phone Number: 02/08/2020, 3:25 PM  Clinical Narrative: Patient has a bed at [facility] in [facility].  [facility] transportation arranged.         Expected Discharge Plan: Hospice Medical Facility Barriers to Discharge: Hospice Bed not available  Expected Discharge Plan and Services Expected Discharge Plan: Hospice Medical Facility   Discharge Planning Services: CM Consult Post Acute Care Choice: Hospice Living arrangements for the past 2 months: Skilled Nursing Facility Expected Discharge Date: 02/08/20               DME Arranged: N/A DME Agency: NA       HH Arranged: NA HH Agency: NA         Social Determinants of Health (SDOH) Interventions    Readmission Risk Interventions No flowsheet data found.

## 2020-02-20 DEATH — deceased

## 2021-06-23 IMAGING — CT CT ABD-PELV W/ CM
2 of 5 series · 14 of 46 positions shown, 16 images · IV contrast (APPLIED)
Comparison: None.

CLINICAL DATA: Abdominal distension

EXAM:
CT ABDOMEN AND PELVIS WITH CONTRAST
TECHNIQUE: Multidetector CT imaging of the abdomen and pelvis was performed
using the standard protocol following bolus administration of
intravenous contrast.
CONTRAST:  75mL OMNIPAQUE IOHEXOL 300 MG/ML  SOLN

[Series 2: axial st · axial · 0.69mm/px · z∈[-775,-395]mm · 11 of 90 slices shown, 13 images]
[im 7/90  soft-tissue]
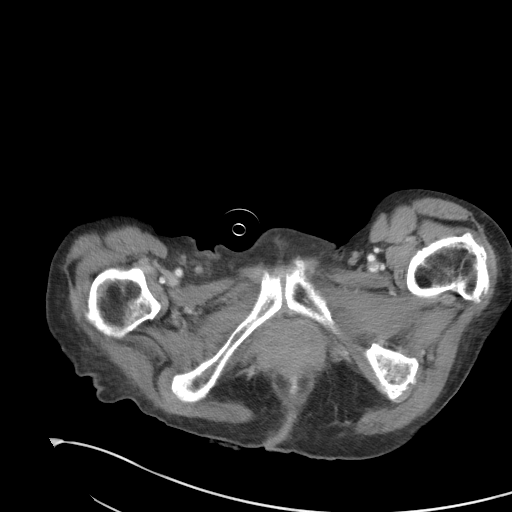
[im 7/90  bone]
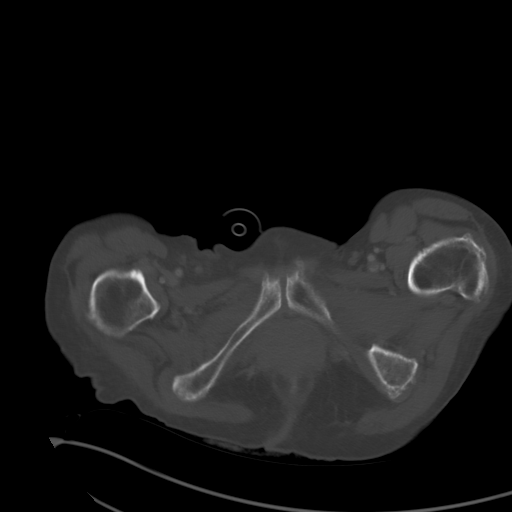
[im 13/90  soft-tissue]
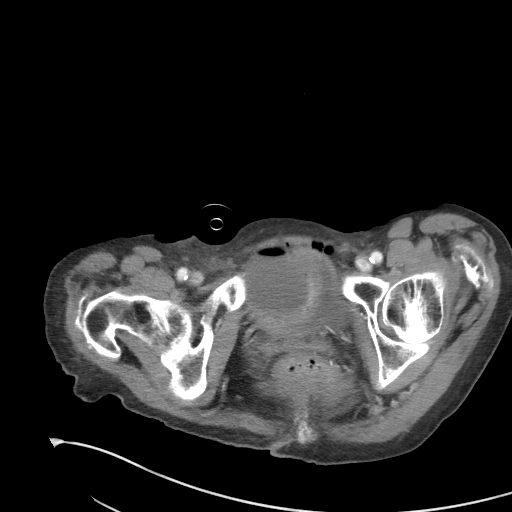
[im 20/90  soft-tissue]
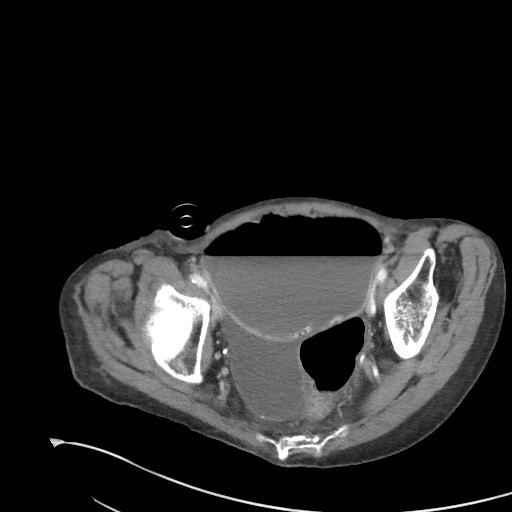
[im 32/90  soft-tissue]
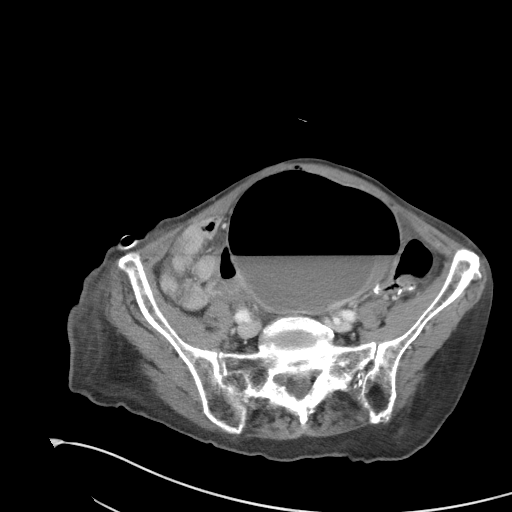
[im 39/90  soft-tissue]
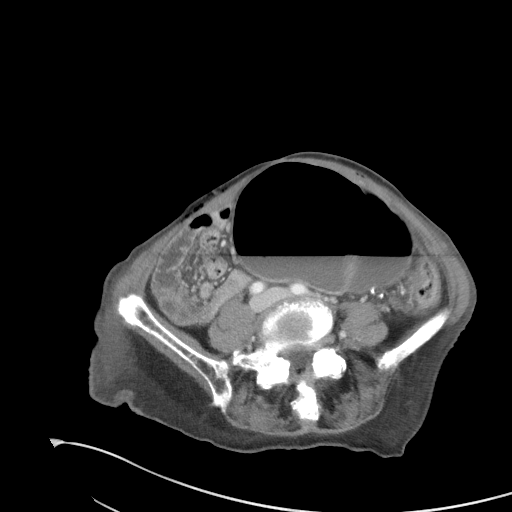
[im 45/90  soft-tissue]
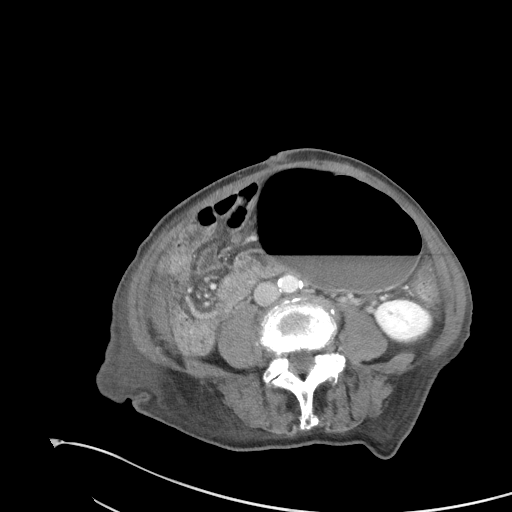
[im 51/90  soft-tissue]
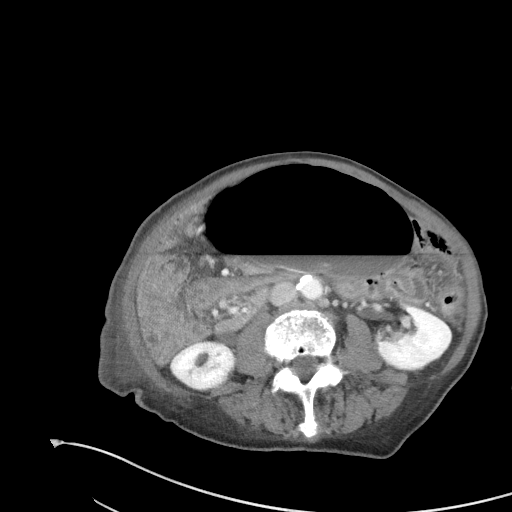
[im 58/90  soft-tissue]
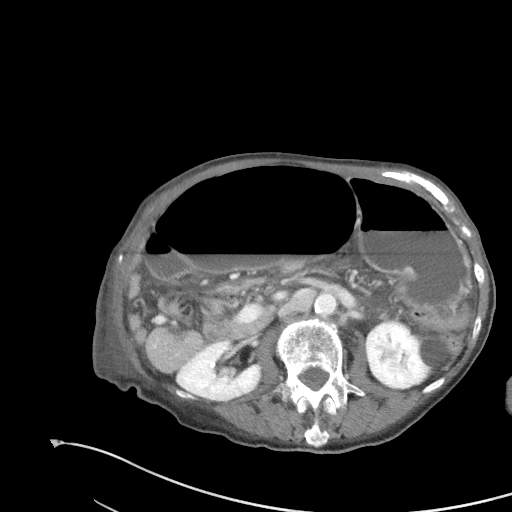
[im 70/90  soft-tissue]
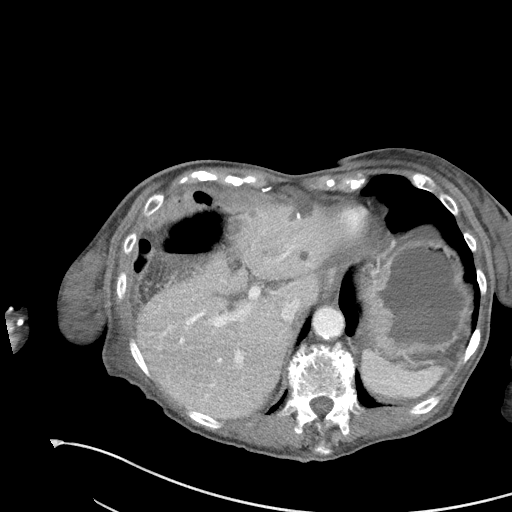
[im 70/90  bone]
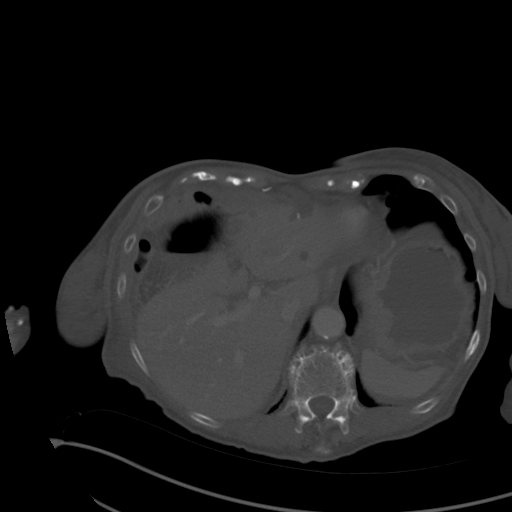
[im 77/90  soft-tissue]
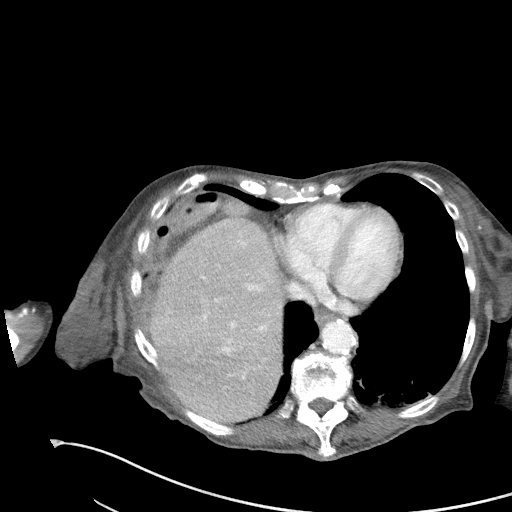
[im 83/90  soft-tissue]
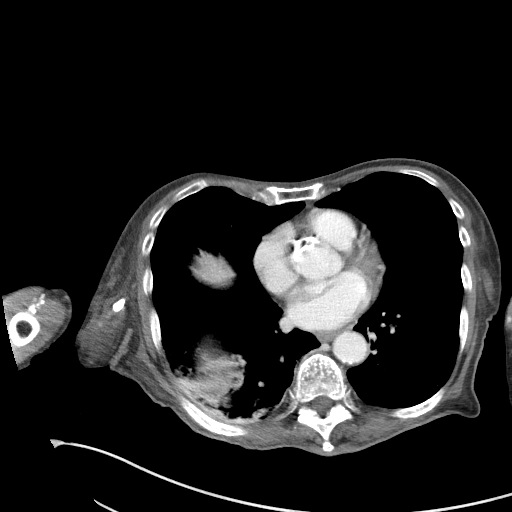

[Series 5: coronal st · coronal · 0.61mm/px · 3 of 75 slices shown]
[im 25/75  soft-tissue]
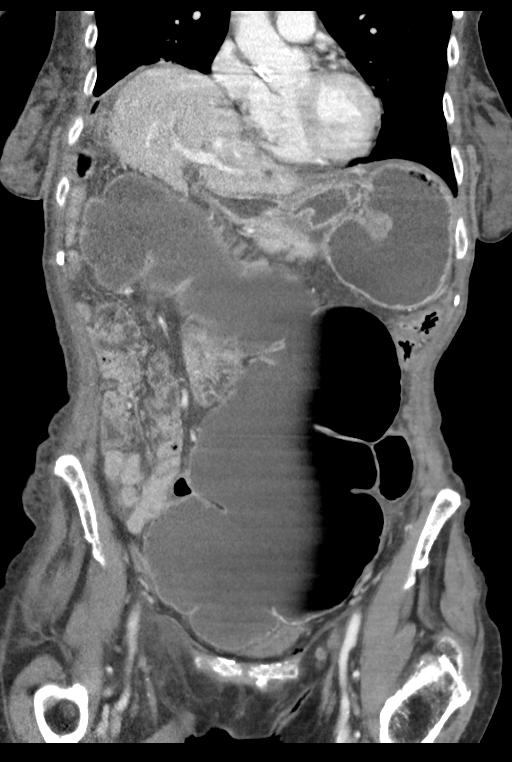
[im 33/75  soft-tissue]
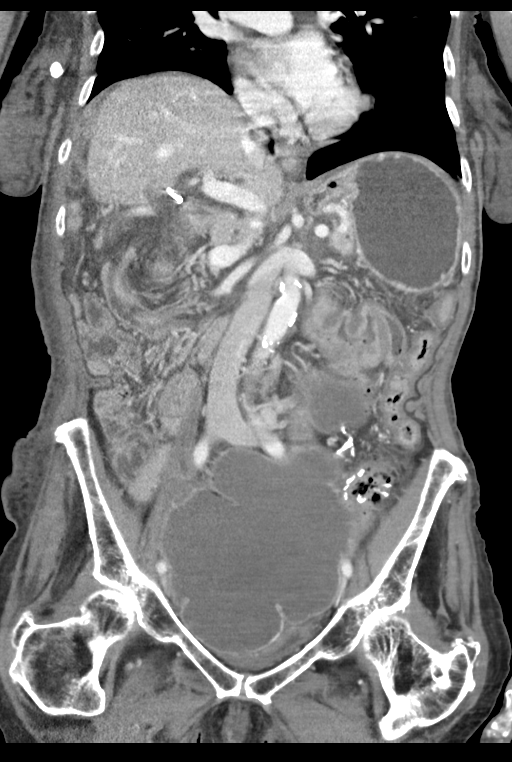
[im 42/75  soft-tissue]
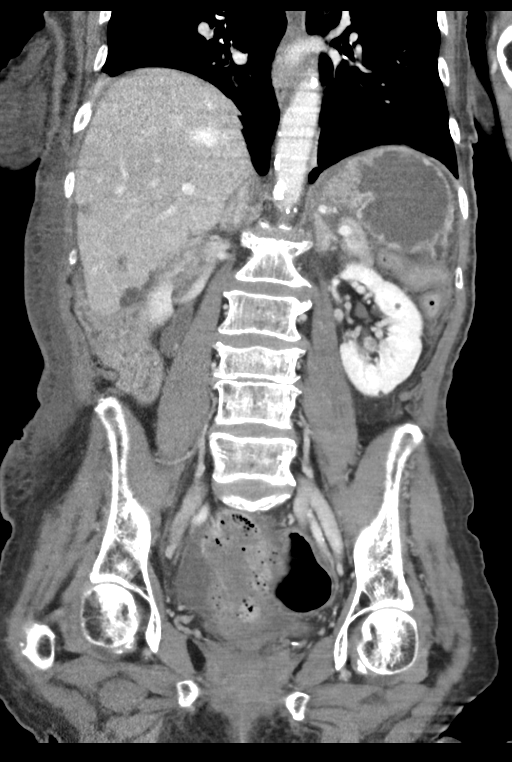

[14 of 46 positions shown; findings below may reference images not displayed]

FINDINGS: Lower chest: Bibasilar atelectasis, right greater than left. Normal
heart size.

Hepatobiliary: Mass effect on the anterior aspect of the right
hepatic lobe from dilated large bowel and mesenteric fat. Multiple
scattered low-density lesions within the liver including one within
the anterior aspect of the left hepatic lobe containing a coarse
calcification. Prior cholecystectomy.

Pancreas: Grossly unremarkable.

Spleen: Unremarkable.

Adrenals/Urinary Tract: No adrenal nodule seen. 3 mm nonobstructing
right renal calculus. Multiple bilateral low-density renal lesions
likely representing cysts, largest measuring to 2.0 cm in the left
kidney. No hydronephrosis. Urinary bladder is decompressed.

Stomach/Bowel: There is massive distention of the cecum measuring up
to 13 cm in diameter with air-fluid level. There is swirling of the
mesentery within the right upper quadrant with large bowel
transition point (series 5, images 31-37). Findings are compatible
with a cecal volvulus. There is pneumatosis within the dilated cecum
most notably along the posterior wall within the pelvis (series 2,
image 64). Multiple tiny punctate foci of air anteriorly suggesting
bowel perforation (series 2, images 59-60). Some of these foci of
air appear to be within the subcutaneous soft tissues as well.

Remainder of the bowel is largely decompressed. Postsurgical changes
in the region of the sigmoid colon.

Vascular/Lymphatic: Aortoiliac atherosclerosis without aneurysm. No
lymphadenopathy is seen.

Reproductive: Uterus not visualized, likely surgically absent.

Other: Small amount of free fluid within the pelvis. Scattered
subcutaneous emphysema noted along the anterior abdominal wall
(series 2, images 49-52).

Musculoskeletal: Moderate age indeterminate compression fracture of
the L3 vertebral body. Multilevel lumbar spondylosis.
IMPRESSION: 1. Massively dilated cecum with swirling of the mesentery in the
right upper quadrant compatible with a cecal volvulus. There is
pneumatosis and evidence of perforation with a tiny amount of
extraluminal free air. Emergent surgical consultation is
recommended.
2. Age-indeterminate compression fracture of L3.
3. Additional findings, as above.

These results were called by telephone at the time of interpretation
on 02/07/2020 at [DATE] to provider GAVIOTA MOUNTAIN , who verbally
acknowledged these results.
